# Patient Record
Sex: Female | Born: 1935 | Race: White | Hispanic: No | Smoking: Former smoker
Health system: Southern US, Community
[De-identification: ages and names within clinical notes are randomized; demographics above are authoritative.]

## PROBLEM LIST (undated history)

## (undated) DIAGNOSIS — K5790 Diverticulosis of intestine, part unspecified, without perforation or abscess without bleeding: Secondary | ICD-10-CM

## (undated) DIAGNOSIS — I10 Essential (primary) hypertension: Secondary | ICD-10-CM

## (undated) DIAGNOSIS — E785 Hyperlipidemia, unspecified: Secondary | ICD-10-CM

## (undated) DIAGNOSIS — J449 Chronic obstructive pulmonary disease, unspecified: Secondary | ICD-10-CM

## (undated) DIAGNOSIS — K219 Gastro-esophageal reflux disease without esophagitis: Secondary | ICD-10-CM

## (undated) DIAGNOSIS — T7840XA Allergy, unspecified, initial encounter: Secondary | ICD-10-CM

## (undated) DIAGNOSIS — E079 Disorder of thyroid, unspecified: Secondary | ICD-10-CM

## (undated) DIAGNOSIS — M1990 Unspecified osteoarthritis, unspecified site: Secondary | ICD-10-CM

## (undated) HISTORY — DX: Diverticulosis of intestine, part unspecified, without perforation or abscess without bleeding: K57.90

## (undated) HISTORY — DX: Allergy, unspecified, initial encounter: T78.40XA

## (undated) HISTORY — DX: Hyperlipidemia, unspecified: E78.5

## (undated) HISTORY — DX: Disorder of thyroid, unspecified: E07.9

## (undated) HISTORY — DX: Chronic obstructive pulmonary disease, unspecified: J44.9

## (undated) HISTORY — DX: Essential (primary) hypertension: I10

## (undated) HISTORY — DX: Gastro-esophageal reflux disease without esophagitis: K21.9

## (undated) HISTORY — DX: Unspecified osteoarthritis, unspecified site: M19.90

---

## 1998-07-25 ENCOUNTER — Ambulatory Visit (HOSPITAL_COMMUNITY): Admission: RE | Admit: 1998-07-25 | Discharge: 1998-07-25 | Payer: Self-pay | Admitting: Obstetrics & Gynecology

## 1998-07-25 ENCOUNTER — Other Ambulatory Visit: Admission: RE | Admit: 1998-07-25 | Discharge: 1998-07-25 | Payer: Self-pay | Admitting: Obstetrics & Gynecology

## 1998-08-29 ENCOUNTER — Ambulatory Visit (HOSPITAL_COMMUNITY): Admission: RE | Admit: 1998-08-29 | Discharge: 1998-08-29 | Payer: Self-pay | Admitting: Family Medicine

## 1999-07-24 ENCOUNTER — Ambulatory Visit (HOSPITAL_COMMUNITY): Admission: RE | Admit: 1999-07-24 | Discharge: 1999-07-24 | Payer: Self-pay | Admitting: Obstetrics & Gynecology

## 2000-08-05 ENCOUNTER — Ambulatory Visit (HOSPITAL_COMMUNITY): Admission: RE | Admit: 2000-08-05 | Discharge: 2000-08-05 | Payer: Self-pay | Admitting: *Deleted

## 2000-09-05 ENCOUNTER — Ambulatory Visit (HOSPITAL_COMMUNITY): Admission: RE | Admit: 2000-09-05 | Discharge: 2000-09-05 | Payer: Self-pay

## 2001-07-28 ENCOUNTER — Ambulatory Visit (HOSPITAL_COMMUNITY): Admission: RE | Admit: 2001-07-28 | Discharge: 2001-07-28 | Payer: Self-pay | Admitting: *Deleted

## 2001-09-06 ENCOUNTER — Ambulatory Visit (HOSPITAL_COMMUNITY): Admission: RE | Admit: 2001-09-06 | Discharge: 2001-09-06 | Payer: Self-pay | Admitting: *Deleted

## 2002-08-03 ENCOUNTER — Ambulatory Visit (HOSPITAL_COMMUNITY): Admission: RE | Admit: 2002-08-03 | Discharge: 2002-08-03 | Payer: Self-pay | Admitting: *Deleted

## 2003-07-31 ENCOUNTER — Ambulatory Visit (HOSPITAL_COMMUNITY): Admission: RE | Admit: 2003-07-31 | Discharge: 2003-07-31 | Payer: Self-pay | Admitting: *Deleted

## 2005-10-29 ENCOUNTER — Ambulatory Visit: Payer: Self-pay | Admitting: Gastroenterology

## 2005-12-08 ENCOUNTER — Ambulatory Visit: Payer: Self-pay | Admitting: Gastroenterology

## 2005-12-16 ENCOUNTER — Encounter (INDEPENDENT_AMBULATORY_CARE_PROVIDER_SITE_OTHER): Payer: Self-pay | Admitting: Specialist

## 2005-12-16 ENCOUNTER — Ambulatory Visit: Payer: Self-pay | Admitting: Gastroenterology

## 2005-12-16 LAB — HM COLONOSCOPY

## 2006-12-27 ENCOUNTER — Ambulatory Visit (HOSPITAL_COMMUNITY): Admission: RE | Admit: 2006-12-27 | Discharge: 2006-12-27 | Payer: Self-pay | Admitting: Ophthalmology

## 2010-11-23 ENCOUNTER — Encounter: Payer: Self-pay | Admitting: Nurse Practitioner

## 2010-11-23 DIAGNOSIS — Z9989 Dependence on other enabling machines and devices: Secondary | ICD-10-CM

## 2010-11-23 DIAGNOSIS — E039 Hypothyroidism, unspecified: Secondary | ICD-10-CM | POA: Insufficient documentation

## 2010-11-23 DIAGNOSIS — I872 Venous insufficiency (chronic) (peripheral): Secondary | ICD-10-CM | POA: Insufficient documentation

## 2010-11-23 DIAGNOSIS — K5790 Diverticulosis of intestine, part unspecified, without perforation or abscess without bleeding: Secondary | ICD-10-CM

## 2010-11-23 DIAGNOSIS — I10 Essential (primary) hypertension: Secondary | ICD-10-CM | POA: Insufficient documentation

## 2010-11-23 DIAGNOSIS — K219 Gastro-esophageal reflux disease without esophagitis: Secondary | ICD-10-CM | POA: Insufficient documentation

## 2010-11-23 DIAGNOSIS — E785 Hyperlipidemia, unspecified: Secondary | ICD-10-CM | POA: Insufficient documentation

## 2010-11-23 DIAGNOSIS — J449 Chronic obstructive pulmonary disease, unspecified: Secondary | ICD-10-CM

## 2010-12-04 NOTE — Op Note (Signed)
NAME:  Nancy Holt, Nancy Holt              ACCOUNT NO.:  [id number]   MEDICAL RECORD NO.:  [id number]          PATIENT TYPE:  AMB   LOCATION:  SDS                          FACILITY:  [facility]   PHYSICIAN:  Robert L. Dione Booze, M.D.  DATE OF BIRTH:  July 28, 1922   DATE OF PROCEDURE:  DATE OF DISCHARGE:                               OPERATIVE REPORT   INDICATIONS AND JUSTIFICATION FOR THE PROCEDURE:  This lady has been  followed in my office for several visits and has noticed for the last  several years redundant skin of each upper eyelid blocking her upper  field of vision.  She came on Nov 29, 2006 to discuss this problem and  was found to have basically severe dermatochalasis.  She reports that  she feels fatigue with her upper eyelids and the lids feel heavy, but  there is no particular irritation.  She does feel this blocks her upper  field of vision.  The surgery has been discussed.  Otherwise, she has a  pressure of 18 in each eye.  The pupil's motility, conjunctivae, cornea,  anterior chamber, lens and dilated fundus examination shows some sign of  cataract and she does have optic nerve cupping suspicious for glaucoma.  She also has significant dermatochalasis with the skin of the eyelids  covering her eyelashes.  Photographs were taken to document this.  Visual field testing with the skin taped compared to when it was not  taped shows a significant reduction in the upper field of vision.  Basically it shows a reduction of about 15 to 20 degrees in each eye.  These issues were discussed and the patient decided to have upper eyelid  blepharoplasties because of her symptoms.  Medically, she should be  stable for this.   JUSTIFICATION FOR PERFORMING THE PROCEDURE IN AN OUTPATIENT SETTING:  Routine.   JUSTIFICATION FOR OVERNIGHT STAY:  None.   PREOPERATIVE DIAGNOSIS:  Severe dermatochalasis with visual impairment.   POSTOPERATIVE DIAGNOSIS:  Severe dermatochalasis with visual impairment.   OPERATION PERFORMED:  Upper eyelid blepharoplasties.   SURGEON:  Robert L. Dione Booze, M.D.   ANESTHESIA:  Xylocaine 1% with epinephrine.   PROCEDURE:  The patient arrived in the minor surgery room and was  prepped and draped in the routine fashion.  The skin to be excised was  carefully demarcated and then excised along with some underlying fatty  tissue.  Bleeding was controlled with pressure and cautery.  Each wound  was closed with running 6-0 nylon suture and cold compresses were  applied along with Polysporin ointment.  The patient left the minor room  having done nicely.   FOLLOWUP CARE:  The patient is to be seen in my office in about 6 days  to have her sutures removed.  She is to use cold compresses for the rest  of the day and is to start warm compresses the next day.  She is to keep  her head elevated for the first day.           ______________________________  Doris Cheadle. Dione Booze, M.D.     RLG/MEDQ  D:  12/28/2006  T:  12/28/2006  Job:  [id number]

## 2012-10-02 ENCOUNTER — Other Ambulatory Visit: Payer: Self-pay | Admitting: *Deleted

## 2012-10-02 DIAGNOSIS — E039 Hypothyroidism, unspecified: Secondary | ICD-10-CM

## 2012-11-14 ENCOUNTER — Other Ambulatory Visit: Payer: Self-pay | Admitting: Nurse Practitioner

## 2012-11-18 ENCOUNTER — Other Ambulatory Visit: Payer: Self-pay | Admitting: Family Medicine

## 2012-11-22 ENCOUNTER — Other Ambulatory Visit: Payer: Self-pay

## 2012-11-22 ENCOUNTER — Ambulatory Visit: Payer: Self-pay

## 2013-01-14 ENCOUNTER — Other Ambulatory Visit: Payer: Self-pay | Admitting: Nurse Practitioner

## 2013-01-30 ENCOUNTER — Other Ambulatory Visit: Payer: Self-pay | Admitting: Nurse Practitioner

## 2013-02-02 ENCOUNTER — Encounter: Payer: Self-pay | Admitting: Nurse Practitioner

## 2013-02-02 ENCOUNTER — Ambulatory Visit (INDEPENDENT_AMBULATORY_CARE_PROVIDER_SITE_OTHER): Payer: Medicare Other | Admitting: Nurse Practitioner

## 2013-02-02 VITALS — BP 119/53 | HR 71 | Temp 97.0°F | Ht 65.0 in | Wt 176.0 lb

## 2013-02-02 DIAGNOSIS — I10 Essential (primary) hypertension: Secondary | ICD-10-CM

## 2013-02-02 DIAGNOSIS — E785 Hyperlipidemia, unspecified: Secondary | ICD-10-CM

## 2013-02-02 DIAGNOSIS — K219 Gastro-esophageal reflux disease without esophagitis: Secondary | ICD-10-CM

## 2013-02-02 DIAGNOSIS — E039 Hypothyroidism, unspecified: Secondary | ICD-10-CM

## 2013-02-02 MED ORDER — LOSARTAN POTASSIUM-HCTZ 100-25 MG PO TABS: 1.0000 | ORAL_TABLET | Freq: Every day | ORAL | Status: DC

## 2013-02-02 MED ORDER — LEVOTHYROXINE SODIUM 25 MCG PO TABS: 25.0000 ug | ORAL_TABLET | Freq: Every day | ORAL | Status: DC

## 2013-02-02 MED ORDER — DILTIAZEM HCL ER COATED BEADS 240 MG PO CP24: 240.0000 mg | ORAL_CAPSULE | Freq: Every day | ORAL | Status: DC

## 2013-02-02 MED ORDER — OMEPRAZOLE 40 MG PO CPDR: 40.0000 mg | DELAYED_RELEASE_CAPSULE | Freq: Every day | ORAL | Status: DC

## 2013-02-02 NOTE — Patient Instructions (Signed)

## 2013-02-02 NOTE — Progress Notes (Signed)
Subjective:    Patient ID: Nancy Holt, female    DOB: Jan 31, 1923, 79 y.o.   MRN: [id number]  Hypertension This is a chronic problem. The current episode started more than 1 year ago. The problem is unchanged. The problem is controlled. Pertinent negatives include no blurred vision, chest pain, headaches, neck pain, orthopnea, palpitations, peripheral edema, shortness of breath or sweats. There are no associated agents to hypertension. Risk factors for coronary artery disease include dyslipidemia. Past treatments include angiotensin blockers and diuretics. The current treatment provides moderate improvement. Compliance problems include diet and exercise.  Hypertensive end-organ damage includes a thyroid problem.  Hyperlipidemia This is a chronic problem. The current episode started more than 1 year ago. The problem is controlled. Recent lipid tests were reviewed and are normal. Exacerbating diseases include hypothyroidism. She has no history of diabetes or obesity. Factors aggravating her hyperlipidemia include thiazides. Pertinent negatives include no chest pain or shortness of breath. Current antihyperlipidemic treatment includes diet change and herbal therapy. The current treatment provides moderate improvement of lipids. Compliance problems include adherence to diet and adherence to exercise.  Risk factors for coronary artery disease include hypertension.  Thyroid Problem Presents for follow-up (hypothyroidism) visit. Patient reports no anxiety, depressed mood, diaphoresis, diarrhea, fatigue, hair loss, heat intolerance, hoarse voice, palpitations, visual change, weight gain or weight loss. The symptoms have been stable. Her past medical history is significant for hyperlipidemia. There is no history of diabetes.  Gerd Omeprazole OTC working well to keep symptoms under control    Review of Systems  Constitutional: Negative for weight loss, weight gain, diaphoresis and fatigue.  HENT:  Negative for hoarse voice and neck pain.   Eyes: Negative for blurred vision.  Respiratory: Negative for shortness of breath.   Cardiovascular: Negative for chest pain, palpitations and orthopnea.  Gastrointestinal: Negative for diarrhea.  Endocrine: Negative for heat intolerance.  Neurological: Negative for headaches.  All other systems reviewed and are negative.       Objective:   Physical Exam  Constitutional: She is oriented to person, place, and time. She appears well-developed and well-nourished.  HENT:  Nose: Nose normal.  Mouth/Throat: Oropharynx is clear and moist.  Eyes: EOM are normal.  Neck: Trachea normal, normal range of motion and full passive range of motion without pain. Neck supple. No JVD present. Carotid bruit is not present. No thyromegaly present.  Cardiovascular: Normal rate, regular rhythm, normal heart sounds and intact distal pulses.  Exam reveals no gallop and no friction rub.   No murmur heard. Pulmonary/Chest: Effort normal and breath sounds normal.  Abdominal: Soft. Bowel sounds are normal. She exhibits no distension and no mass. There is no tenderness.  Musculoskeletal: Normal range of motion.  Lymphadenopathy:    She has no cervical adenopathy.  Neurological: She is alert and oriented to person, place, and time. She has normal reflexes.  Skin: Skin is warm and dry.  Psychiatric: She has a normal mood and affect. Her behavior is normal. Judgment and thought content normal.   BP 119/53  Pulse 71  Temp(Src) 97 F (36.1 C) (Oral)  Ht 5\' 5"  (1.651 m)  Wt 176 lb (79.833 kg)  BMI 29.29 kg/m2        Assessment & Plan:  1. Hypertension Low NA+ diet - losartan-hydrochlorothiazide (HYZAAR) 100-25 MG per tablet; Take 1 tablet by mouth daily.  Dispense: 30 tablet; Refill: 5 - diltiazem (CARDIZEM CD) 240 MG 24 hr capsule; Take 1 capsule (240 mg total) by mouth daily.  Dispense: 30 capsule; Refill: 5 - COMPLETE METABOLIC PANEL WITH GFR  2.  Hyperlipidemia Low fat diet an dexercise - NMR Lipoprofile with Lipids  3. Hypothyroidism - levothyroxine (SYNTHROID, LEVOTHROID) 25 MCG tablet; Take 1 tablet (25 mcg total) by mouth daily.  Dispense: 30 tablet; Refill: 5 - Thyroid Panel With TSH  4. GERD (gastroesophageal reflux disease) Avoid spicy foods Do not eat 2 hours rpior to bedtime - omeprazole (PRILOSEC) 40 MG capsule; Take 1 capsule (40 mg total) by mouth daily.  Dispense: 30 capsule; Refill: 5  Mary-Margaret Daphine Deutscher, FNP

## 2013-02-03 LAB — COMPLETE METABOLIC PANEL WITH GFR
ALT: 10 U/L (ref 0–35)
AST: 13 U/L (ref 0–37)
Albumin: 4.2 g/dL (ref 3.5–5.2)
Alkaline Phosphatase: 20 U/L — ABNORMAL LOW (ref 39–117)
BUN: 22 mg/dL (ref 6–23)
CO2: 31 mEq/L (ref 19–32)
Calcium: 9.6 mg/dL (ref 8.4–10.5)
Chloride: 102 mEq/L (ref 96–112)
Creat: 1.25 mg/dL — ABNORMAL HIGH (ref 0.50–1.10)
GFR, Est African American: 44 mL/min — ABNORMAL LOW
GFR, Est Non African American: 38 mL/min — ABNORMAL LOW
Glucose, Bld: 99 mg/dL (ref 70–99)
Potassium: 4.5 mEq/L (ref 3.5–5.3)
Sodium: 138 mEq/L (ref 135–145)
Total Bilirubin: 0.3 mg/dL (ref 0.3–1.2)
Total Protein: 7.2 g/dL (ref 6.0–8.3)

## 2013-02-03 LAB — THYROID PANEL WITH TSH
Free Thyroxine Index: 2.7 (ref 1.0–3.9)
T3 Uptake: 36 % (ref 22.5–37.0)
T4, Total: 7.6 ug/dL (ref 5.0–12.5)
TSH: 3.111 u[IU]/mL (ref 0.350–4.500)

## 2013-02-06 LAB — NMR LIPOPROFILE WITH LIPIDS
Cholesterol, Total: 185 mg/dL (ref <200)
HDL Particle Number: 31 umol/L (ref >=30.5)
HDL Size: 9.1 nm — ABNORMAL LOW (ref >=9.2)
HDL-C: 53 mg/dL (ref >=40)
LDL (calc): 110 mg/dL — ABNORMAL HIGH (ref <100)
LDL Particle Number: 1339 nmol/L — ABNORMAL HIGH (ref <1000)
LDL Size: 21.4 nm (ref >20.5)
LP-IR Score: <25 (ref <=45)
Large HDL-P: 7.1 umol/L (ref >=4.8)
Large VLDL-P: 1 nmol/L (ref <=2.7)
Small LDL Particle Number: 410 nmol/L (ref <=527)
Triglycerides: 108 mg/dL (ref <150)
VLDL Size: 40.9 nm (ref <=46.6)

## 2013-02-13 ENCOUNTER — Other Ambulatory Visit: Payer: Self-pay | Admitting: Nurse Practitioner

## 2013-03-07 ENCOUNTER — Other Ambulatory Visit: Payer: Self-pay | Admitting: *Deleted

## 2013-03-07 MED ORDER — FUROSEMIDE 40 MG PO TABS: 40.0000 mg | ORAL_TABLET | Freq: Two times a day (BID) | ORAL | Status: DC

## 2013-03-07 NOTE — Telephone Encounter (Signed)
Pharmacy sent refill request for furosemide but was not listed in epic. Wasn't sure if we should refill or not. Please advise. Original rx was from last year

## 2013-05-23 ENCOUNTER — Ambulatory Visit (INDEPENDENT_AMBULATORY_CARE_PROVIDER_SITE_OTHER): Payer: Medicare Other | Admitting: Nurse Practitioner

## 2013-05-23 VITALS — BP 122/56 | HR 73 | Temp 97.3°F | Ht 65.0 in | Wt 178.0 lb

## 2013-05-23 DIAGNOSIS — K219 Gastro-esophageal reflux disease without esophagitis: Secondary | ICD-10-CM

## 2013-05-23 DIAGNOSIS — I10 Essential (primary) hypertension: Secondary | ICD-10-CM

## 2013-05-23 DIAGNOSIS — E039 Hypothyroidism, unspecified: Secondary | ICD-10-CM

## 2013-05-23 DIAGNOSIS — E785 Hyperlipidemia, unspecified: Secondary | ICD-10-CM

## 2013-05-23 DIAGNOSIS — E559 Vitamin D deficiency, unspecified: Secondary | ICD-10-CM

## 2013-05-23 NOTE — Progress Notes (Signed)
Subjective:    Patient ID: Nancy Holt, female    DOB: 29-Jan-1923, 79 y.o.   MRN: [id number]  Hypertension This is a chronic problem. The current episode started more than 1 year ago. The problem is unchanged. The problem is controlled. Pertinent negatives include no blurred vision, chest pain, headaches, neck pain, orthopnea, palpitations, peripheral edema, shortness of breath or sweats. There are no associated agents to hypertension. Risk factors for coronary artery disease include dyslipidemia and post-menopausal state. Past treatments include angiotensin blockers and diuretics. The current treatment provides moderate improvement. Compliance problems include diet and exercise.  Hypertensive end-organ damage includes a thyroid problem.  Hyperlipidemia This is a chronic problem. The current episode started more than 1 year ago. The problem is controlled. Recent lipid tests were reviewed and are normal. Exacerbating diseases include hypothyroidism. She has no history of diabetes or obesity. Factors aggravating her hyperlipidemia include thiazides. Pertinent negatives include no chest pain or shortness of breath. Current antihyperlipidemic treatment includes diet change and herbal therapy. The current treatment provides moderate improvement of lipids. Compliance problems include adherence to diet and adherence to exercise.  Risk factors for coronary artery disease include hypertension.  Thyroid Problem Presents for follow-up (hypothyroidism) visit. Patient reports no anxiety, depressed mood, diaphoresis, diarrhea, fatigue, hair loss, heat intolerance, hoarse voice, palpitations, visual change, weight gain or weight loss. The symptoms have been stable. Her past medical history is significant for hyperlipidemia. There is no history of diabetes.  Gastrophageal Reflux She reports no chest pain, no heartburn or no hoarse voice. This is a chronic problem. The current episode started more than 1 year ago.  The problem occurs rarely. The problem has been resolved. The symptoms are aggravated by certain foods. Pertinent negatives include no fatigue or weight loss. She has tried a PPI for the symptoms. The treatment provided significant relief.      Review of Systems  Constitutional: Negative for weight loss, weight gain, diaphoresis and fatigue.  HENT: Negative for hoarse voice.   Eyes: Negative for blurred vision.  Respiratory: Negative for shortness of breath.   Cardiovascular: Negative for chest pain, palpitations and orthopnea.  Gastrointestinal: Negative for heartburn and diarrhea.  Endocrine: Negative for heat intolerance.  Musculoskeletal: Negative for neck pain.  Neurological: Negative for headaches.  All other systems reviewed and are negative.       Objective:   Physical Exam  Constitutional: She is oriented to person, place, and time. She appears well-developed and well-nourished.  HENT:  Nose: Nose normal.  Mouth/Throat: Oropharynx is clear and moist.  Eyes: EOM are normal.  Neck: Trachea normal, normal range of motion and full passive range of motion without pain. Neck supple. No JVD present. Carotid bruit is not present. No thyromegaly present.  Cardiovascular: Normal rate, regular rhythm, normal heart sounds and intact distal pulses.  Exam reveals no gallop and no friction rub.   No murmur heard. Pulmonary/Chest: Effort normal and breath sounds normal.  Abdominal: Soft. Bowel sounds are normal. She exhibits no distension and no mass. There is no tenderness.  Musculoskeletal: Normal range of motion. She exhibits no edema.  Lymphadenopathy:    She has no cervical adenopathy.  Neurological: She is alert and oriented to person, place, and time. She has normal reflexes.  Skin: Skin is warm and dry.  Psychiatric: She has a normal mood and affect. Her behavior is normal. Judgment and thought content normal.   BP 122/56  Pulse 73  Temp(Src) 97.3 F (36.3 C) (Oral)  Ht 5'  5" (1.651 m)  Wt 178 lb (80.74 kg)  BMI 29.62 kg/m2        Assessment & Plan:   1. HTN (hypertension)   2. GERD (gastroesophageal reflux disease)   3. Hypothyroidism   4. Hyperlipemia   5. Unspecified vitamin D deficiency    Orders Placed This Encounter  Procedures  . CMP14+EGFR  . NMR, lipoprofile  . Vit D  25 hydroxy (rtn osteoporosis monitoring)     Continue all meds Labs pending Diet and exercise encouraged Health maintenance reviewed Follow up in 3 months  Mary-Margaret Daphine Deutscher, FNP

## 2013-05-23 NOTE — Patient Instructions (Signed)

## 2013-05-24 ENCOUNTER — Encounter: Payer: Self-pay | Admitting: Nurse Practitioner

## 2013-05-25 LAB — CMP14+EGFR
ALT: 12 IU/L (ref 0–32)
AST: 14 IU/L (ref 0–40)
Albumin/Globulin Ratio: 1.6 (ref 1.1–2.5)
Albumin: 4.2 g/dL (ref 3.5–4.7)
Alkaline Phosphatase: 21 IU/L — ABNORMAL LOW (ref 39–117)
BUN/Creatinine Ratio: 17 (ref 11–26)
BUN: 20 mg/dL (ref 8–27)
CO2: 25 mmol/L (ref 18–29)
Calcium: 9.7 mg/dL (ref 8.6–10.2)
Chloride: 100 mmol/L (ref 97–108)
Creatinine, Ser: 1.18 mg/dL — ABNORMAL HIGH (ref 0.57–1.00)
GFR calc Af Amer: 48 mL/min/{1.73_m2} — ABNORMAL LOW (ref >59)
GFR calc non Af Amer: 41 mL/min/{1.73_m2} — ABNORMAL LOW (ref >59)
Globulin, Total: 2.6 g/dL (ref 1.5–4.5)
Glucose: 94 mg/dL (ref 65–99)
Potassium: 4.6 mmol/L (ref 3.5–5.2)
Sodium: 140 mmol/L (ref 134–144)
Total Bilirubin: 0.3 mg/dL (ref 0.0–1.2)
Total Protein: 6.8 g/dL (ref 6.0–8.5)

## 2013-05-25 LAB — NMR, LIPOPROFILE
Cholesterol: 194 mg/dL (ref <200)
HDL Cholesterol by NMR: 56 mg/dL (ref >=40)
HDL Particle Number: 34.1 umol/L (ref >=30.5)
LDL Particle Number: 1498 nmol/L — ABNORMAL HIGH (ref <1000)
LDL Size: 21.3 nm (ref >20.5)
LDLC SERPL CALC-MCNC: 115 mg/dL — ABNORMAL HIGH (ref <100)
LP-IR Score: <25 (ref <=45)
Small LDL Particle Number: 424 nmol/L (ref <=527)
Triglycerides by NMR: 113 mg/dL (ref <150)

## 2013-05-25 LAB — VITAMIN D 25 HYDROXY (VIT D DEFICIENCY, FRACTURES): Vit D, 25-Hydroxy: 55 ng/mL (ref 30.0–100.0)

## 2013-06-05 ENCOUNTER — Encounter: Payer: Self-pay | Admitting: *Deleted

## 2013-06-05 NOTE — Progress Notes (Signed)
Quick Note:  Copy of labs sent to patient ______ 

## 2013-06-07 LAB — HM DEXA SCAN: HM Dexa Scan: NORMAL

## 2013-07-11 ENCOUNTER — Other Ambulatory Visit: Payer: Self-pay | Admitting: Nurse Practitioner

## 2013-08-08 ENCOUNTER — Other Ambulatory Visit: Payer: Self-pay | Admitting: Nurse Practitioner

## 2013-10-10 ENCOUNTER — Other Ambulatory Visit: Payer: Self-pay | Admitting: Nurse Practitioner

## 2013-11-07 ENCOUNTER — Encounter: Payer: Self-pay | Admitting: Nurse Practitioner

## 2013-11-07 ENCOUNTER — Ambulatory Visit (INDEPENDENT_AMBULATORY_CARE_PROVIDER_SITE_OTHER): Payer: Commercial Managed Care - HMO | Admitting: Nurse Practitioner

## 2013-11-07 VITALS — BP 138/79 | HR 67 | Temp 98.0°F | Ht 65.0 in | Wt 173.4 lb

## 2013-11-07 DIAGNOSIS — E039 Hypothyroidism, unspecified: Secondary | ICD-10-CM

## 2013-11-07 DIAGNOSIS — J449 Chronic obstructive pulmonary disease, unspecified: Secondary | ICD-10-CM

## 2013-11-07 DIAGNOSIS — Z713 Dietary counseling and surveillance: Secondary | ICD-10-CM

## 2013-11-07 DIAGNOSIS — Z6828 Body mass index (BMI) 28.0-28.9, adult: Secondary | ICD-10-CM

## 2013-11-07 DIAGNOSIS — E785 Hyperlipidemia, unspecified: Secondary | ICD-10-CM

## 2013-11-07 DIAGNOSIS — K219 Gastro-esophageal reflux disease without esophagitis: Secondary | ICD-10-CM

## 2013-11-07 DIAGNOSIS — I2510 Atherosclerotic heart disease of native coronary artery without angina pectoris: Secondary | ICD-10-CM

## 2013-11-07 DIAGNOSIS — I10 Essential (primary) hypertension: Secondary | ICD-10-CM

## 2013-11-07 MED ORDER — OMEPRAZOLE 40 MG PO CPDR: 40.0000 mg | DELAYED_RELEASE_CAPSULE | Freq: Every day | ORAL | Status: DC

## 2013-11-07 MED ORDER — DILTIAZEM HCL ER COATED BEADS 240 MG PO CP24: ORAL_CAPSULE | ORAL | Status: DC

## 2013-11-07 MED ORDER — LOSARTAN POTASSIUM-HCTZ 100-25 MG PO TABS: ORAL_TABLET | ORAL | Status: DC

## 2013-11-07 MED ORDER — LEVOTHYROXINE SODIUM 25 MCG PO TABS: ORAL_TABLET | ORAL | Status: DC

## 2013-11-07 NOTE — Patient Instructions (Signed)

## 2013-11-07 NOTE — Progress Notes (Signed)
Subjective:    Patient ID: Nancy Holt, female    DOB: September 19, 1922, 80 y.o.   MRN: [id number]  Patient here today for follow up of chronic medical problems. No complaints today- Just recently had a life line screening and all test were normal.   Hypertension This is a chronic problem. The current episode started more than 1 year ago. The problem is unchanged. The problem is controlled. Pertinent negatives include no blurred vision, chest pain, headaches, neck pain, orthopnea, palpitations, peripheral edema, shortness of breath or sweats. There are no associated agents to hypertension. Risk factors for coronary artery disease include dyslipidemia and post-menopausal state. Past treatments include angiotensin blockers and diuretics. The current treatment provides moderate improvement. Compliance problems include diet and exercise.  Hypertensive end-organ damage includes a thyroid problem.  Hyperlipidemia This is a chronic problem. The current episode started more than 1 year ago. The problem is controlled. Recent lipid tests were reviewed and are normal. Exacerbating diseases include hypothyroidism. She has no history of diabetes or obesity. Factors aggravating her hyperlipidemia include thiazides. Pertinent negatives include no chest pain or shortness of breath. Current antihyperlipidemic treatment includes diet change and herbal therapy. The current treatment provides moderate improvement of lipids. Compliance problems include adherence to diet and adherence to exercise.  Risk factors for coronary artery disease include hypertension.  Thyroid Problem Presents for follow-up (hypothyroidism) visit. Patient reports no anxiety, depressed mood, diaphoresis, diarrhea, fatigue, hair loss, heat intolerance, hoarse voice, palpitations, visual change, weight gain or weight loss. The symptoms have been stable. Her past medical history is significant for hyperlipidemia. There is no history of diabetes.   Gastrophageal Reflux She reports no chest pain, no heartburn or no hoarse voice. This is a chronic problem. The current episode started more than 1 year ago. The problem occurs rarely. The problem has been resolved. The symptoms are aggravated by certain foods. Pertinent negatives include no fatigue or weight loss. She has tried a PPI for the symptoms. The treatment provided significant relief.      Review of Systems  Constitutional: Negative for weight loss, weight gain, diaphoresis and fatigue.  HENT: Negative for hoarse voice.   Eyes: Negative for blurred vision.  Respiratory: Negative for shortness of breath.   Cardiovascular: Negative for chest pain, palpitations and orthopnea.  Gastrointestinal: Negative for heartburn and diarrhea.  Endocrine: Negative for heat intolerance.  Musculoskeletal: Negative for neck pain.  Neurological: Negative for headaches.  All other systems reviewed and are negative.      Objective:   Physical Exam  Constitutional: She is oriented to person, place, and time. She appears well-developed and well-nourished.  HENT:  Nose: Nose normal.  Mouth/Throat: Oropharynx is clear and moist.  Eyes: EOM are normal.  Neck: Trachea normal, normal range of motion and full passive range of motion without pain. Neck supple. No JVD present. Carotid bruit is not present. No thyromegaly present.  Cardiovascular: Normal rate, regular rhythm, normal heart sounds and intact distal pulses.  Exam reveals no gallop and no friction rub.   No murmur heard. Pulmonary/Chest: Effort normal and breath sounds normal.  Abdominal: Soft. Bowel sounds are normal. She exhibits no distension and no mass. There is no tenderness.  Musculoskeletal: Normal range of motion. She exhibits no edema.  Lymphadenopathy:    She has no cervical adenopathy.  Neurological: She is alert and oriented to person, place, and time. She has normal reflexes.  Skin: Skin is warm and dry.  Psychiatric: She  has a normal  mood and affect. Her behavior is normal. Judgment and thought content normal.   BP 138/79  Pulse 67  Temp(Src) 98 F (36.7 C) (Oral)  Ht $R'5\' 5"'SU$  (1.651 m)  Wt 173 lb 6.4 oz (78.654 kg)  BMI 28.86 kg/m2        Assessment & Plan:   1. Hypothyroidism   2. Hyperlipemia   3. HTN (hypertension)   4. GERD (gastroesophageal reflux disease)   5. COPD (chronic obstructive pulmonary disease)   6. BMI 28.0-28.9,adult   7. Weight loss counseling, encounter for   8. CAD (coronary artery disease)    Orders Placed This Encounter  Procedures  . HM DEXA SCAN    This external order was created through the Results Console.  . CMP14+EGFR  . NMR, lipoprofile  . Thyroid Panel With TSH   Meds ordered this encounter  Medications  . omeprazole (PRILOSEC) 40 MG capsule    Sig: Take 1 capsule (40 mg total) by mouth daily.    Dispense:  30 capsule    Refill:  5    Order Specific Question:  Supervising Provider    Answer:  Chipper Herb [[id number]]  . diltiazem (CARDIZEM CD) 240 MG 24 hr capsule    Sig: TAKE 1 CAPSULE (240 MG TOTAL) BY MOUTH DAILY.    Dispense:  30 capsule    Refill:  5    Order Specific Question:  Supervising Provider    Answer:  Chipper Herb [[id number]]  . levothyroxine (SYNTHROID, LEVOTHROID) 25 MCG tablet    Sig: TAKE ONE TABLET BY MOUTH ONE TIME DAILY    Dispense:  30 tablet    Refill:  5    Order Specific Question:  Supervising Provider    Answer:  Chipper Herb [[id number]]  . losartan-hydrochlorothiazide (HYZAAR) 100-25 MG per tablet    Sig: TAKE 1 TABLET BY MOUTH DAILY.    Dispense:  30 tablet    Refill:  5    Order Specific Question:  Supervising Provider    Answer:  Chipper Herb [[id number]]   Refuses colonoscopy and mammo Labs pending Health maintenance reviewed Diet and exercise encouraged Continue all meds Follow up  In 6 months   [place], FNP

## 2013-11-08 ENCOUNTER — Other Ambulatory Visit: Payer: Self-pay | Admitting: Nurse Practitioner

## 2013-11-08 DIAGNOSIS — E785 Hyperlipidemia, unspecified: Secondary | ICD-10-CM

## 2013-11-08 LAB — CMP14+EGFR
ALT: 11 IU/L (ref 0–32)
AST: 17 IU/L (ref 0–40)
Albumin/Globulin Ratio: 1.8 (ref 1.1–2.5)
Albumin: 4.4 g/dL (ref 3.5–4.7)
Alkaline Phosphatase: 23 IU/L — ABNORMAL LOW (ref 39–117)
BUN/Creatinine Ratio: 24 (ref 11–26)
BUN: 26 mg/dL (ref 8–27)
CO2: 25 mmol/L (ref 18–29)
Calcium: 10 mg/dL (ref 8.7–10.3)
Chloride: 99 mmol/L (ref 97–108)
Creatinine, Ser: 1.07 mg/dL — ABNORMAL HIGH (ref 0.57–1.00)
GFR calc Af Amer: 53 mL/min/{1.73_m2} — ABNORMAL LOW (ref >59)
GFR calc non Af Amer: 46 mL/min/{1.73_m2} — ABNORMAL LOW (ref >59)
Globulin, Total: 2.5 g/dL (ref 1.5–4.5)
Glucose: 90 mg/dL (ref 65–99)
Potassium: 5.1 mmol/L (ref 3.5–5.2)
Sodium: 142 mmol/L (ref 134–144)
Total Bilirubin: 0.3 mg/dL (ref 0.0–1.2)
Total Protein: 6.9 g/dL (ref 6.0–8.5)

## 2013-11-08 LAB — NMR, LIPOPROFILE
Cholesterol: 208 mg/dL — ABNORMAL HIGH (ref <200)
HDL Cholesterol by NMR: 55 mg/dL (ref >=40)
HDL Particle Number: 32.9 umol/L (ref >=30.5)
LDL Particle Number: 1699 nmol/L — ABNORMAL HIGH (ref <1000)
LDL Size: 21.1 nm (ref >20.5)
LDLC SERPL CALC-MCNC: 133 mg/dL — ABNORMAL HIGH (ref <100)
LP-IR Score: 40 (ref <=45)
Small LDL Particle Number: 761 nmol/L — ABNORMAL HIGH (ref <=527)
Triglycerides by NMR: 102 mg/dL (ref <150)

## 2013-11-08 LAB — THYROID PANEL WITH TSH
Free Thyroxine Index: 1.9 (ref 1.2–4.9)
T3 Uptake Ratio: 29 % (ref 24–39)
T4, Total: 6.4 ug/dL (ref 4.5–12.0)
TSH: 2.81 u[IU]/mL (ref 0.450–4.500)

## 2013-11-08 MED ORDER — ATORVASTATIN CALCIUM 40 MG PO TABS: 40.0000 mg | ORAL_TABLET | Freq: Every day | ORAL | Status: DC

## 2013-11-12 ENCOUNTER — Other Ambulatory Visit: Payer: Self-pay | Admitting: Nurse Practitioner

## 2013-11-12 MED ORDER — SIMVASTATIN 40 MG PO TABS: 40.0000 mg | ORAL_TABLET | Freq: Every day | ORAL | Status: DC

## 2014-01-15 ENCOUNTER — Other Ambulatory Visit: Payer: Self-pay | Admitting: Nurse Practitioner

## 2014-04-25 ENCOUNTER — Encounter: Payer: Self-pay | Admitting: Nurse Practitioner

## 2014-04-25 ENCOUNTER — Ambulatory Visit (INDEPENDENT_AMBULATORY_CARE_PROVIDER_SITE_OTHER): Payer: Commercial Managed Care - HMO | Admitting: Nurse Practitioner

## 2014-04-25 VITALS — BP 171/67 | HR 72 | Temp 98.9°F | Ht 64.0 in | Wt 172.2 lb

## 2014-04-25 DIAGNOSIS — K5730 Diverticulosis of large intestine without perforation or abscess without bleeding: Secondary | ICD-10-CM

## 2014-04-25 DIAGNOSIS — E785 Hyperlipidemia, unspecified: Secondary | ICD-10-CM

## 2014-04-25 DIAGNOSIS — I872 Venous insufficiency (chronic) (peripheral): Secondary | ICD-10-CM

## 2014-04-25 DIAGNOSIS — M25561 Pain in right knee: Secondary | ICD-10-CM

## 2014-04-25 DIAGNOSIS — E034 Atrophy of thyroid (acquired): Secondary | ICD-10-CM

## 2014-04-25 DIAGNOSIS — I10 Essential (primary) hypertension: Secondary | ICD-10-CM

## 2014-04-25 DIAGNOSIS — Z23 Encounter for immunization: Secondary | ICD-10-CM

## 2014-04-25 DIAGNOSIS — K219 Gastro-esophageal reflux disease without esophagitis: Secondary | ICD-10-CM

## 2014-04-25 DIAGNOSIS — E038 Other specified hypothyroidism: Secondary | ICD-10-CM

## 2014-04-25 DIAGNOSIS — L03032 Cellulitis of left toe: Secondary | ICD-10-CM

## 2014-04-25 DIAGNOSIS — I2510 Atherosclerotic heart disease of native coronary artery without angina pectoris: Secondary | ICD-10-CM

## 2014-04-25 DIAGNOSIS — J410 Simple chronic bronchitis: Secondary | ICD-10-CM

## 2014-04-25 DIAGNOSIS — M25562 Pain in left knee: Secondary | ICD-10-CM

## 2014-04-25 MED ORDER — DILTIAZEM HCL ER COATED BEADS 240 MG PO CP24: ORAL_CAPSULE | ORAL | Status: DC

## 2014-04-25 MED ORDER — LOSARTAN POTASSIUM-HCTZ 100-25 MG PO TABS: ORAL_TABLET | ORAL | Status: DC

## 2014-04-25 MED ORDER — LEVOTHYROXINE SODIUM 25 MCG PO TABS: ORAL_TABLET | ORAL | Status: DC

## 2014-04-25 MED ORDER — SIMVASTATIN 40 MG PO TABS: 40.0000 mg | ORAL_TABLET | Freq: Every day | ORAL | Status: DC

## 2014-04-25 MED ORDER — OMEPRAZOLE 40 MG PO CPDR: 40.0000 mg | DELAYED_RELEASE_CAPSULE | Freq: Every day | ORAL | Status: DC

## 2014-04-25 NOTE — Progress Notes (Signed)
Subjective:    Patient ID: Nancy Holt, female    DOB: 1923/02/24, 80 y.o.   MRN: [id number]  Patient here today for follow up of chronic medical problems. No complaints today-  Hypertension This is a chronic problem. The current episode started more than 1 year ago. The problem is unchanged. The problem is controlled. Pertinent negatives include no blurred vision, chest pain, headaches, neck pain, orthopnea, palpitations, peripheral edema, shortness of breath or sweats. There are no associated agents to hypertension. Risk factors for coronary artery disease include dyslipidemia and post-menopausal state. Past treatments include angiotensin blockers and diuretics. The current treatment provides moderate improvement. Compliance problems include diet and exercise.  Hypertensive end-organ damage includes a thyroid problem.  Hyperlipidemia This is a chronic problem. The current episode started more than 1 year ago. The problem is controlled. Recent lipid tests were reviewed and are normal. Exacerbating diseases include hypothyroidism. She has no history of diabetes or obesity. Factors aggravating her hyperlipidemia include thiazides. Pertinent negatives include no chest pain or shortness of breath. Current antihyperlipidemic treatment includes diet change and herbal therapy. The current treatment provides moderate improvement of lipids. Compliance problems include adherence to diet and adherence to exercise.  Risk factors for coronary artery disease include hypertension.  Thyroid Problem Presents for follow-up (hypothyroidism) visit. Patient reports no anxiety, depressed mood, diaphoresis, diarrhea, fatigue, hair loss, heat intolerance, hoarse voice, palpitations, visual change, weight gain or weight loss. The symptoms have been stable. Her past medical history is significant for hyperlipidemia. There is no history of diabetes.  Gastrophageal Reflux She reports no chest pain, no heartburn or no hoarse  voice. This is a chronic problem. The current episode started more than 1 year ago. The problem occurs rarely. The problem has been resolved. The symptoms are aggravated by certain foods. Pertinent negatives include no fatigue or weight loss. She has tried a PPI for the symptoms. The treatment provided significant relief.      Review of Systems  Constitutional: Negative for weight loss, weight gain, diaphoresis and fatigue.  HENT: Negative for hoarse voice.   Eyes: Negative for blurred vision.  Respiratory: Negative for shortness of breath.   Cardiovascular: Negative for chest pain, palpitations and orthopnea.  Gastrointestinal: Negative for heartburn and diarrhea.  Endocrine: Negative for heat intolerance.  Musculoskeletal: Negative for neck pain.  Neurological: Negative for headaches.  All other systems reviewed and are negative.      Objective:   Physical Exam  Constitutional: She is oriented to person, place, and time. She appears well-developed and well-nourished.  HENT:  Nose: Nose normal.  Mouth/Throat: Oropharynx is clear and moist.  Eyes: EOM are normal.  Neck: Trachea normal, normal range of motion and full passive range of motion without pain. Neck supple. No JVD present. Carotid bruit is not present. No thyromegaly present.  Cardiovascular: Normal rate, regular rhythm, normal heart sounds and intact distal pulses.  Exam reveals no gallop and no friction rub.   No murmur heard. Pulmonary/Chest: Effort normal and breath sounds normal.  Abdominal: Soft. Bowel sounds are normal. She exhibits no distension and no mass. There is no tenderness.  Musculoskeletal: Normal range of motion. She exhibits edema (1+ bil lower ext).  Bile knee edema with pain on flexion and extension  Lymphadenopathy:    She has no cervical adenopathy.  Neurological: She is alert and oriented to person, place, and time. She has normal reflexes.  Skin: Skin is warm and dry.  Psychiatric: She has a  normal  mood and affect. Her behavior is normal. Judgment and thought content normal.   BP 171/67  Pulse 72  Temp(Src) 98.9 F (37.2 C) (Oral)  Ht 5' 4" (1.626 m)  Wt 172 lb 3.2 oz (78.109 kg)  BMI 29.54 kg/m2        Assessment & Plan:   1. Venous (peripheral) insufficiency  2. Hypothyroidism due to acquired atrophy of thyroid - levothyroxine (SYNTHROID, LEVOTHROID) 25 MCG tablet; TAKE ONE TABLET BY MOUTH ONE TIME DAILY  Dispense: 30 tablet; Refill: 5 - Thyroid Panel With TSH  3. Hyperlipemia Low fat diet and exercise - simvastatin (ZOCOR) 40 MG tablet; Take 1 tablet (40 mg total) by mouth at bedtime.  Dispense: 30 tablet; Refill: 5 - NMR, lipoprofile  4. Essential hypertension Low Na+ diet - losartan-hydrochlorothiazide (HYZAAR) 100-25 MG per tablet; TAKE 1 TABLET BY MOUTH DAILY.  Dispense: 30 tablet; Refill: 5 - CMP14+EGFR  5. Gastroesophageal reflux disease without esophagitis Avoid caffeine - omeprazole (PRILOSEC) 40 MG capsule; Take 1 capsule (40 mg total) by mouth daily.  Dispense: 30 capsule; Refill: 5  6. Diverticulosis of large intestine without hemorrhage Diet with no seeds or vegetables with skin  7. Simple chronic bronchitis  8. Coronary artery disease involving native coronary artery of native heart without angina pectoris - diltiazem (CARDIZEM CD) 240 MG 24 hr capsule; TAKE 1 CAPSULE (240 MG TOTAL) BY MOUTH DAILY.  Dispense: 30 capsule; Refill: 5 9. Bil knee pain - referral to ortho 10. Onychomycosis of left great toe - referral to podiatrist  Labs pending Health maintenance reviewed Diet and exercise encouraged Continue all meds Follow up  In 3 month   [place], FNP

## 2014-04-25 NOTE — Patient Instructions (Signed)

## 2014-04-26 LAB — THYROID PANEL WITH TSH
Free Thyroxine Index: 2 (ref 1.2–4.9)
T3 Uptake Ratio: 31 % (ref 24–39)
T4, Total: 6.5 ug/dL (ref 4.5–12.0)
TSH: 2.76 u[IU]/mL (ref 0.450–4.500)

## 2014-04-26 LAB — CMP14+EGFR
ALT: 11 IU/L (ref 0–32)
AST: 15 IU/L (ref 0–40)
Albumin/Globulin Ratio: 1.6 (ref 1.1–2.5)
Albumin: 4.1 g/dL (ref 3.5–4.7)
Alkaline Phosphatase: 24 IU/L — ABNORMAL LOW (ref 39–117)
BUN/Creatinine Ratio: 18 (ref 11–26)
BUN: 18 mg/dL (ref 8–27)
CO2: 24 mmol/L (ref 18–29)
Calcium: 9.4 mg/dL (ref 8.7–10.3)
Chloride: 99 mmol/L (ref 97–108)
Creatinine, Ser: 1.02 mg/dL — ABNORMAL HIGH (ref 0.57–1.00)
GFR calc Af Amer: 56 mL/min/{1.73_m2} — ABNORMAL LOW (ref >59)
GFR calc non Af Amer: 49 mL/min/{1.73_m2} — ABNORMAL LOW (ref >59)
Globulin, Total: 2.5 g/dL (ref 1.5–4.5)
Glucose: 92 mg/dL (ref 65–99)
Potassium: 4.5 mmol/L (ref 3.5–5.2)
Sodium: 141 mmol/L (ref 134–144)
Total Bilirubin: 0.4 mg/dL (ref 0.0–1.2)
Total Protein: 6.6 g/dL (ref 6.0–8.5)

## 2014-04-26 LAB — NMR, LIPOPROFILE
Cholesterol: 207 mg/dL — ABNORMAL HIGH (ref 100–199)
HDL Cholesterol by NMR: 53 mg/dL (ref >39)
HDL Particle Number: 32.5 umol/L (ref >=30.5)
LDL Particle Number: 1574 nmol/L — ABNORMAL HIGH (ref <1000)
LDL Size: 21.3 nm (ref >20.5)
LDLC SERPL CALC-MCNC: 131 mg/dL — ABNORMAL HIGH (ref 0–99)
LP-IR Score: 29 (ref <=45)
Small LDL Particle Number: 513 nmol/L (ref <=527)
Triglycerides by NMR: 114 mg/dL (ref 0–149)

## 2014-05-01 ENCOUNTER — Telehealth: Payer: Self-pay | Admitting: Family Medicine

## 2014-05-01 NOTE — Telephone Encounter (Signed)
Message copied by Azalee CourseFULP, Hakeen Shipes on Wed May 01, 2014  2:04 PM ------      Message from: Bennie PieriniMARTIN, MARY-MARGARET      Created: Fri Apr 26, 2014  6:05 PM       Kidney and liver function stable      Creatine up slightly      LDL particle numbers are some better but still to high- zocor not strong enough to bring down will patient agree to go on lipitor or crestor      Thyroid panel normal      Continue current meds- low fat diet and exercise and recheck in 3 months       ------

## 2014-05-15 ENCOUNTER — Other Ambulatory Visit: Payer: Self-pay | Admitting: Nurse Practitioner

## 2014-08-26 ENCOUNTER — Ambulatory Visit: Payer: Commercial Managed Care - HMO | Attending: Orthopedic Surgery | Admitting: Physical Therapy

## 2014-08-26 DIAGNOSIS — I10 Essential (primary) hypertension: Secondary | ICD-10-CM | POA: Insufficient documentation

## 2014-08-26 DIAGNOSIS — M170 Bilateral primary osteoarthritis of knee: Secondary | ICD-10-CM | POA: Insufficient documentation

## 2014-11-11 ENCOUNTER — Other Ambulatory Visit: Payer: Self-pay | Admitting: Nurse Practitioner

## 2014-12-03 ENCOUNTER — Other Ambulatory Visit: Payer: Self-pay | Admitting: Nurse Practitioner

## 2014-12-09 ENCOUNTER — Other Ambulatory Visit: Payer: Self-pay | Admitting: Nurse Practitioner

## 2015-01-04 ENCOUNTER — Ambulatory Visit (INDEPENDENT_AMBULATORY_CARE_PROVIDER_SITE_OTHER): Payer: Commercial Managed Care - HMO | Admitting: Family

## 2015-01-04 ENCOUNTER — Encounter: Payer: Self-pay | Admitting: Family

## 2015-01-04 VITALS — BP 165/72 | HR 65 | Temp 97.8°F | Ht 64.0 in | Wt 173.0 lb

## 2015-01-04 DIAGNOSIS — M6588 Other synovitis and tenosynovitis, other site: Secondary | ICD-10-CM | POA: Diagnosis not present

## 2015-01-04 DIAGNOSIS — M7750 Other enthesopathy of unspecified foot: Secondary | ICD-10-CM

## 2015-01-04 MED ORDER — KETOROLAC TROMETHAMINE 30 MG/ML IJ SOLN
30.0000 mg | Freq: Once | INTRAMUSCULAR | Status: AC
  Administered 2015-01-04: 30 mg via INTRAMUSCULAR

## 2015-01-04 MED ORDER — METHYLPREDNISOLONE ACETATE 80 MG/ML IJ SUSP
80.0000 mg | Freq: Once | INTRAMUSCULAR | Status: AC
  Administered 2015-01-04: 80 mg via INTRAMUSCULAR

## 2015-01-04 NOTE — Patient Instructions (Addendum)

## 2015-01-04 NOTE — Progress Notes (Signed)
   Subjective:    Patient ID: Nancy Holt, female    DOB: Aug 27, 1922, 81 y.o.   MRN: [id number]  Foot Pain This is a recurrent problem. The current episode started yesterday. The problem occurs constantly. The problem has been unchanged. Associated symptoms include joint swelling. Pertinent negatives include no anorexia, change in bowel habit, chills, congestion, fever, headaches or weakness. The symptoms are aggravated by walking. She has tried rest, NSAIDs and ice for the symptoms. The treatment provided mild relief.      Review of Systems  Constitutional: Negative.  Negative for fever and chills.  HENT: Negative.  Negative for congestion.   Eyes: Negative.   Respiratory: Negative.  Negative for shortness of breath.   Cardiovascular: Negative.  Negative for palpitations.  Gastrointestinal: Negative.  Negative for anorexia and change in bowel habit.  Endocrine: Negative.   Genitourinary: Negative.   Musculoskeletal: Positive for joint swelling.  Neurological: Negative.  Negative for weakness and headaches.  Hematological: Negative.   Psychiatric/Behavioral: Negative.   All other systems reviewed and are negative.      Objective:   Physical Exam  Constitutional: She is oriented to person, place, and time. She appears well-developed and well-nourished. No distress.  HENT:  Head: Normocephalic and atraumatic.  Eyes: Pupils are equal, round, and reactive to light.  Neck: Normal range of motion. Neck supple. No thyromegaly present.  Cardiovascular: Normal rate, regular rhythm, normal heart sounds and intact distal pulses.   No murmur heard. Pulmonary/Chest: Effort normal and breath sounds normal. No respiratory distress. She has no wheezes.  Abdominal: Soft. Bowel sounds are normal. She exhibits no distension. There is no tenderness.  Musculoskeletal: Normal range of motion. She exhibits edema (2+ swelling in left ankle, tenderness with palpation below fifth metatarsal) and  tenderness.  Neurological: She is alert and oriented to person, place, and time. She has normal reflexes. No cranial nerve deficit.  Skin: Skin is warm and dry.  Psychiatric: She has a normal mood and affect. Her behavior is normal. Judgment and thought content normal.  Vitals reviewed.    Blood pressure 165/72, pulse 65, temperature 97.8 F (36.6 C), temperature source Oral, height 5\' 4"  (1.626 m), weight 173 lb (78.472 kg). s     Assessment & Plan:  1. Tendinitis of foot -Rest -Ice -Motrin  With food -Elevate when possible -If not improved- Pt will need x-ray on Monday - ketorolac (TORADOL) 30 MG/ML injection 30 mg; Inject 1 mL (30 mg total) into the muscle once. - methylPREDNISolone acetate (DEPO-MEDROL) injection 80 mg; Inject 1 mL (80 mg total) into the muscle once.  Jannifer Rodney, FNP

## 2015-01-08 ENCOUNTER — Other Ambulatory Visit: Payer: Self-pay | Admitting: Family Medicine

## 2015-01-09 ENCOUNTER — Other Ambulatory Visit: Payer: Self-pay

## 2015-01-09 MED ORDER — LOSARTAN POTASSIUM-HCTZ 100-25 MG PO TABS: 1.0000 | ORAL_TABLET | Freq: Every day | ORAL | Status: DC

## 2015-01-17 ENCOUNTER — Other Ambulatory Visit: Payer: Self-pay

## 2015-01-17 DIAGNOSIS — E034 Atrophy of thyroid (acquired): Secondary | ICD-10-CM

## 2015-01-17 MED ORDER — LEVOTHYROXINE SODIUM 25 MCG PO TABS: ORAL_TABLET | ORAL | Status: DC

## 2015-01-21 ENCOUNTER — Ambulatory Visit (INDEPENDENT_AMBULATORY_CARE_PROVIDER_SITE_OTHER): Payer: Commercial Managed Care - HMO | Admitting: Nurse Practitioner

## 2015-01-21 ENCOUNTER — Ambulatory Visit (INDEPENDENT_AMBULATORY_CARE_PROVIDER_SITE_OTHER): Payer: Commercial Managed Care - HMO

## 2015-01-21 ENCOUNTER — Encounter: Payer: Self-pay | Admitting: Nurse Practitioner

## 2015-01-21 VITALS — BP 142/69 | HR 70 | Temp 96.9°F | Ht 64.0 in | Wt 168.0 lb

## 2015-01-21 DIAGNOSIS — K219 Gastro-esophageal reflux disease without esophagitis: Secondary | ICD-10-CM | POA: Diagnosis not present

## 2015-01-21 DIAGNOSIS — I2510 Atherosclerotic heart disease of native coronary artery without angina pectoris: Secondary | ICD-10-CM

## 2015-01-21 DIAGNOSIS — J410 Simple chronic bronchitis: Secondary | ICD-10-CM

## 2015-01-21 DIAGNOSIS — E038 Other specified hypothyroidism: Secondary | ICD-10-CM | POA: Diagnosis not present

## 2015-01-21 DIAGNOSIS — E785 Hyperlipidemia, unspecified: Secondary | ICD-10-CM

## 2015-01-21 DIAGNOSIS — Z23 Encounter for immunization: Secondary | ICD-10-CM

## 2015-01-21 DIAGNOSIS — E034 Atrophy of thyroid (acquired): Secondary | ICD-10-CM | POA: Diagnosis not present

## 2015-01-21 DIAGNOSIS — I10 Essential (primary) hypertension: Secondary | ICD-10-CM

## 2015-01-21 DIAGNOSIS — I872 Venous insufficiency (chronic) (peripheral): Secondary | ICD-10-CM | POA: Diagnosis not present

## 2015-01-21 LAB — POCT CBC
Granulocyte percent: 79.9 %G (ref 37–80)
HCT, POC: 43.9 % (ref 37.7–47.9)
Hemoglobin: 14.1 g/dL (ref 12.2–16.2)
Lymph, poc: 1.8 (ref 0.6–3.4)
MCH, POC: 27.8 pg (ref 27–31.2)
MCHC: 32.2 g/dL (ref 31.8–35.4)
MCV: 86.4 fL (ref 80–97)
MPV: 9.3 fL (ref 0–99.8)
POC Granulocyte: 9 — AB (ref 2–6.9)
POC LYMPH PERCENT: 15.7 %L (ref 10–50)
Platelet Count, POC: 210 10*3/uL (ref 142–424)
RBC: 5.08 M/uL (ref 4.04–5.48)
RDW, POC: 13.8 %
WBC: 11.3 10*3/uL — AB (ref 4.6–10.2)

## 2015-01-21 NOTE — Patient Instructions (Signed)
Bone Health Our bones do many things. They provide structure, protect organs, anchor muscles, and store calcium. Adequate calcium in your diet and weight-bearing physical activity help build strong bones, improve bone amounts, and may reduce the risk of weakening of bones (osteoporosis) later in life. PEAK BONE MASS By age 79, the average woman has acquired most of her skeletal bone mass. A large decline occurs in older adults which increases the risk of osteoporosis. In women this occurs around the time of menopause. It is important for young girls to reach their peak bone mass in order to maintain bone health throughout life. A person with high bone mass as a young adult will be more likely to have a higher bone mass later in life. Not enough calcium consumption and physical activity early on could result in a failure to achieve optimum bone mass in adulthood. OSTEOPOROSIS Osteoporosis is a disease of the bones. It is defined as low bone mass with deterioration of bone structure. Osteoporosis leads to an increase risk of fractures with falls. These fractures commonly happen in the wrist, hip, and spine. While men and women of all ages and background can develop osteoporosis, some of the risk factors for osteoporosis are:  Female.  White.  Postmenopausal.  Older adults.  Small in body size.  Eating a diet low in calcium.  Physically inactive.  Smoking.  Use of some medications.  Family history. CALCIUM Calcium is a mineral needed by the body for healthy bones, teeth, and proper function of the heart, muscles, and nerves. The body cannot produce calcium so it must be absorbed through food. Good sources of calcium include:  Dairy products (low fat or nonfat milk, cheese, and yogurt).  Dark green leafy vegetables (bok choy and broccoli).  Calcium fortified foods (orange juice, cereal, bread, soy beverages, and tofu products).  Nuts (almonds). Recommended amounts of calcium vary  for individuals. RECOMMENDED CALCIUM INTAKES Age and Amount in mg per day  Children 1 to 3 years / 700 mg  Children 4 to 8 years / 1,000 mg  Children 9 to 13 years / 1,300 mg  Teens 14 to 18 years / 1,300 mg  Adults 19 to 50 years / 1,000 mg  Adult women 51 to 70 years / 1,200 mg  Adults 71 years and older / 1,200 mg  Pregnant and breastfeeding teens / 1,300 mg  Pregnant and breastfeeding adults / 1,000 mg Vitamin D also plays an important role in healthy bone development. Vitamin D helps in the absorption of calcium. WEIGHT-BEARING PHYSICAL ACTIVITY Regular physical activity has many positive health benefits. Benefits include strong bones. Weight-bearing physical activity early in life is important in reaching peak bone mass. Weight-bearing physical activities cause muscles and bones to work against gravity. Some examples of weight bearing physical activities include:  Walking, jogging, or running.  Field Hockey.  Jumping rope.  Dancing.  Soccer.  Tennis or Racquetball.  Stair climbing.  Basketball.  Hiking.  Weight lifting.  Aerobic fitness classes. Including weight-bearing physical activity into an exercise plan is a great way to keep bones healthy. Adults: Engage in at least 30 minutes of moderate physical activity on most, preferably all, days of the week. Children: Engage in at least 60 minutes of moderate physical activity on most, preferably all, days of the week. FOR MORE INFORMATION [place] Department of Agriculture, Center for Nutrition Policy and Promotion: www.cnpp.usda.gov National Osteoporosis Foundation: www.nof.org Document Released: 09/25/2003 Document Revised: 10/30/2012 Document Reviewed: 12/25/2008 Exit[employer] Patient Information   2015 [employer]. This information is not intended to replace advice given to you by your health care provider. Make sure you discuss any questions you have with your health care provider.  

## 2015-01-21 NOTE — Progress Notes (Signed)
Subjective:    Patient ID: Nancy Holt, female    DOB: [birth year], 81 y.o.   MRN: [id number]  Patient here today for follow up of chronic medical problems. No complaints today-  Hypertension This is a chronic problem. The current episode started more than 1 year ago. The problem is unchanged. The problem is controlled. Pertinent negatives include no chest pain, headaches, neck pain, palpitations or shortness of breath. Risk factors for coronary artery disease include dyslipidemia, female gender, obesity, post-menopausal state and sedentary lifestyle. Past treatments include angiotensin blockers and diuretics. The current treatment provides moderate improvement. Compliance problems include diet and exercise.  Hypertensive end-organ damage includes CAD/MI and a thyroid problem. There is no history of CVA.  Hyperlipidemia This is a chronic problem. The current episode started more than 1 year ago. The problem is controlled. Recent lipid tests were reviewed and are normal. Exacerbating diseases include hypothyroidism and obesity. She has no history of diabetes. Pertinent negatives include no chest pain or shortness of breath. She is currently on no antihyperlipidemic treatment. Risk factors for coronary artery disease include dyslipidemia, hypertension, obesity and post-menopausal.  Thyroid Problem Visit type: hypothyroidism. Patient reports no diaphoresis, diarrhea, fatigue, heat intolerance, palpitations or visual change. Her past medical history is significant for hyperlipidemia. There is no history of diabetes.  Gastrophageal Reflux She reports no chest pain. Pertinent negatives include no fatigue.  CAD On diltiazem- hasn't seen cardiologist in many years COPD Currently on no meds- no recent flare ups GERD On no meds because she has very in frequent flare ups Peripheral insufficiency Frequent swelling which resolves when she elevates her legs    Review of Systems  Constitutional:  Negative for diaphoresis and fatigue.  Respiratory: Negative for shortness of breath.   Cardiovascular: Negative for chest pain and palpitations.  Gastrointestinal: Negative for diarrhea.  Endocrine: Negative for heat intolerance.  Musculoskeletal: Negative for neck pain.  Neurological: Negative for headaches.  All other systems reviewed and are negative.      Objective:   Physical Exam  Constitutional: She is oriented to person, place, and time. She appears well-developed and well-nourished.  HENT:  Nose: Nose normal.  Mouth/Throat: Oropharynx is clear and moist.  Eyes: EOM are normal.  Neck: Trachea normal, normal range of motion and full passive range of motion without pain. Neck supple. No JVD present. Carotid bruit is not present. No thyromegaly present.  Cardiovascular: Normal rate, regular rhythm, normal heart sounds and intact distal pulses.  Exam reveals no gallop and no friction rub.   No murmur heard. Pulmonary/Chest: Effort normal and breath sounds normal.  Abdominal: Soft. Bowel sounds are normal. She exhibits no distension and no mass. There is no tenderness.  Musculoskeletal: Normal range of motion. She exhibits edema (1+ bil lower ext).  Bile knee edema with pain on flexion and extension  Lymphadenopathy:    She has no cervical adenopathy.  Neurological: She is alert and oriented to person, place, and time. She has normal reflexes.  Skin: Skin is warm and dry.  Psychiatric: She has a normal mood and affect. Her behavior is normal. Judgment and thought content normal.    BP 142/69 mmHg  Pulse 70  Temp(Src) 96.9 F (36.1 C) (Oral)  Ht $R'5\' 4"'Jr$  (1.626 m)  Wt 168 lb (76.204 kg)  BMI 28.82 kg/m2  Chest x ray- Mild caridomagally-Preliminary reading by Ronnald Collum, FNP  [facility]   EKG- NSR- Mary-Margaret Hassell Done, FNP       Assessment & Plan:  1. Venous (peripheral) insufficiency Elevate legs when sitting  2. Essential hypertension Do not add salt to diet -  CMP14+EGFR  3. Simple chronic bronchitis  4. Gastroesophageal reflux disease without esophagitis Avoid spicy foods Do not eat 2 hours prior to bedtime  5. Hypothyroidism due to acquired atrophy of thyroid - Thyroid Panel With TSH  6. Hyperlipemia Low fat diet - Lipid panel  7. Coronary artery disease involving native coronary artery of native heart without angina pectoris - DG Chest 2 View; Future - EKG 12-Lead - POCT CBC    Labs pending Health maintenance reviewed Diet and exercise encouraged Continue all meds Follow up  In 3 month   [place], FNP

## 2015-01-21 NOTE — Addendum Note (Signed)
Addended by: Cleda DaubUCKER, Lary Eckardt G on: 01/21/2015 03:56 PM   Modules accepted: Orders

## 2015-01-22 ENCOUNTER — Encounter: Payer: Self-pay | Admitting: *Deleted

## 2015-01-22 LAB — CMP14+EGFR
ALT: 12 IU/L (ref 0–32)
AST: 15 IU/L (ref 0–40)
Albumin/Globulin Ratio: 1.7 (ref 1.1–2.5)
Albumin: 4.5 g/dL (ref 3.2–4.6)
Alkaline Phosphatase: 19 IU/L — ABNORMAL LOW (ref 39–117)
BUN/Creatinine Ratio: 18 (ref 11–26)
BUN: 20 mg/dL (ref 10–36)
Bilirubin Total: 0.5 mg/dL (ref 0.0–1.2)
CO2: 24 mmol/L (ref 18–29)
Calcium: 9.6 mg/dL (ref 8.7–10.3)
Chloride: 91 mmol/L — ABNORMAL LOW (ref 97–108)
Creatinine, Ser: 1.13 mg/dL — ABNORMAL HIGH (ref 0.57–1.00)
GFR calc Af Amer: 49 mL/min/{1.73_m2} — ABNORMAL LOW (ref >59)
GFR calc non Af Amer: 43 mL/min/{1.73_m2} — ABNORMAL LOW (ref >59)
Globulin, Total: 2.6 g/dL (ref 1.5–4.5)
Glucose: 104 mg/dL — ABNORMAL HIGH (ref 65–99)
Potassium: 4.6 mmol/L (ref 3.5–5.2)
Sodium: 131 mmol/L — ABNORMAL LOW (ref 134–144)
Total Protein: 7.1 g/dL (ref 6.0–8.5)

## 2015-01-22 LAB — THYROID PANEL WITH TSH
Free Thyroxine Index: 2.3 (ref 1.2–4.9)
T3 Uptake Ratio: 33 % (ref 24–39)
T4, Total: 7 ug/dL (ref 4.5–12.0)
TSH: 3.07 u[IU]/mL (ref 0.450–4.500)

## 2015-01-22 LAB — LIPID PANEL
Chol/HDL Ratio: 2.8 ratio units (ref 0.0–4.4)
Cholesterol, Total: 191 mg/dL (ref 100–199)
HDL: 68 mg/dL (ref >39)
LDL Calculated: 102 mg/dL — ABNORMAL HIGH (ref 0–99)
Triglycerides: 103 mg/dL (ref 0–149)
VLDL Cholesterol Cal: 21 mg/dL (ref 5–40)

## 2015-04-25 ENCOUNTER — Encounter: Payer: Self-pay | Admitting: Nurse Practitioner

## 2015-04-25 ENCOUNTER — Ambulatory Visit (INDEPENDENT_AMBULATORY_CARE_PROVIDER_SITE_OTHER): Payer: Commercial Managed Care - HMO | Admitting: Nurse Practitioner

## 2015-04-25 VITALS — BP 149/70 | HR 74 | Temp 97.5°F | Ht 64.0 in | Wt 170.0 lb

## 2015-04-25 DIAGNOSIS — I872 Venous insufficiency (chronic) (peripheral): Secondary | ICD-10-CM

## 2015-04-25 DIAGNOSIS — I2510 Atherosclerotic heart disease of native coronary artery without angina pectoris: Secondary | ICD-10-CM | POA: Diagnosis not present

## 2015-04-25 DIAGNOSIS — J410 Simple chronic bronchitis: Secondary | ICD-10-CM

## 2015-04-25 DIAGNOSIS — E034 Atrophy of thyroid (acquired): Secondary | ICD-10-CM

## 2015-04-25 DIAGNOSIS — Z6829 Body mass index (BMI) 29.0-29.9, adult: Secondary | ICD-10-CM | POA: Insufficient documentation

## 2015-04-25 DIAGNOSIS — E785 Hyperlipidemia, unspecified: Secondary | ICD-10-CM | POA: Diagnosis not present

## 2015-04-25 DIAGNOSIS — K219 Gastro-esophageal reflux disease without esophagitis: Secondary | ICD-10-CM | POA: Diagnosis not present

## 2015-04-25 DIAGNOSIS — E038 Other specified hypothyroidism: Secondary | ICD-10-CM | POA: Diagnosis not present

## 2015-04-25 DIAGNOSIS — R1310 Dysphagia, unspecified: Secondary | ICD-10-CM

## 2015-04-25 DIAGNOSIS — I10 Essential (primary) hypertension: Secondary | ICD-10-CM

## 2015-04-25 MED ORDER — LOSARTAN POTASSIUM-HCTZ 100-25 MG PO TABS: 1.0000 | ORAL_TABLET | Freq: Every day | ORAL | Status: DC

## 2015-04-25 MED ORDER — DILTIAZEM HCL ER COATED BEADS 240 MG PO CP24: 240.0000 mg | ORAL_CAPSULE | Freq: Every day | ORAL | Status: DC

## 2015-04-25 MED ORDER — LEVOTHYROXINE SODIUM 25 MCG PO TABS: ORAL_TABLET | ORAL | Status: DC

## 2015-04-25 NOTE — Patient Instructions (Signed)
Health Maintenance, Female Adopting a healthy lifestyle and getting preventive care can go a long way to promote health and wellness. Talk with your health care provider about what schedule of regular examinations is right for you. This is a good chance for you to check in with your provider about disease prevention and staying healthy. In between checkups, there are plenty of things you can do on your own. Experts have done a lot of research about which lifestyle changes and preventive measures are most likely to keep you healthy. Ask your health care provider for more information. WEIGHT AND DIET  Eat a healthy diet  Be sure to include plenty of vegetables, fruits, low-fat dairy products, and lean protein.  Do not eat a lot of foods high in solid fats, added sugars, or salt.  Get regular exercise. This is one of the most important things you can do for your health.  Most adults should exercise for at least 150 minutes each week. The exercise should increase your heart rate and make you sweat (moderate-intensity exercise).  Most adults should also do strengthening exercises at least twice a week. This is in addition to the moderate-intensity exercise.  Maintain a healthy weight  Body mass index (BMI) is a measurement that can be used to identify possible weight problems. It estimates body fat based on height and weight. Your health care provider can help determine your BMI and help you achieve or maintain a healthy weight.  For females 20 years of age and older:   A BMI below 18.5 is considered underweight.  A BMI of 18.5 to 24.9 is normal.  A BMI of 25 to 29.9 is considered overweight.  A BMI of 30 and above is considered obese.  Watch levels of cholesterol and blood lipids  You should start having your blood tested for lipids and cholesterol at 79 years of age, then have this test every 5 years.  You may need to have your cholesterol levels checked more often if:  Your lipid  or cholesterol levels are high.  You are older than 79 years of age.  You are at high risk for heart disease.  CANCER SCREENING   Lung Cancer  Lung cancer screening is recommended for adults 55-80 years old who are at high risk for lung cancer because of a history of smoking.  A yearly low-dose CT scan of the lungs is recommended for people who:  Currently smoke.  Have quit within the past 15 years.  Have at least a 30-pack-year history of smoking. A pack year is smoking an average of one pack of cigarettes a day for 1 year.  Yearly screening should continue until it has been 15 years since you quit.  Yearly screening should stop if you develop a health problem that would prevent you from having lung cancer treatment.  Breast Cancer  Practice breast self-awareness. This means understanding how your breasts normally appear and feel.  It also means doing regular breast self-exams. Let your health care provider know about any changes, no matter how small.  If you are in your 20s or 30s, you should have a clinical breast exam (CBE) by a health care provider every 1-3 years as part of a regular health exam.  If you are 40 or older, have a CBE every year. Also consider having a breast X-ray (mammogram) every year.  If you have a family history of breast cancer, talk to your health care provider about genetic screening.  If you   are at high risk for breast cancer, talk to your health care provider about having an MRI and a mammogram every year.  Breast cancer gene (BRCA) assessment is recommended for women who have family members with BRCA-related cancers. BRCA-related cancers include:  Breast.  Ovarian.  Tubal.  Peritoneal cancers.  Results of the assessment will determine the need for genetic counseling and BRCA1 and BRCA2 testing. Cervical Cancer Your health care provider may recommend that you be screened regularly for cancer of the pelvic organs (ovaries, uterus, and  vagina). This screening involves a pelvic examination, including checking for microscopic changes to the surface of your cervix (Pap test). You may be encouraged to have this screening done every 3 years, beginning at age 21.  For women ages 30-65, health care providers may recommend pelvic exams and Pap testing every 3 years, or they may recommend the Pap and pelvic exam, combined with testing for human papilloma virus (HPV), every 5 years. Some types of HPV increase your risk of cervical cancer. Testing for HPV may also be done on women of any age with unclear Pap test results.  Other health care providers may not recommend any screening for nonpregnant women who are considered low risk for pelvic cancer and who do not have symptoms. Ask your health care provider if a screening pelvic exam is right for you.  If you have had past treatment for cervical cancer or a condition that could lead to cancer, you need Pap tests and screening for cancer for at least 20 years after your treatment. If Pap tests have been discontinued, your risk factors (such as having a new sexual partner) need to be reassessed to determine if screening should resume. Some women have medical problems that increase the chance of getting cervical cancer. In these cases, your health care provider may recommend more frequent screening and Pap tests. Colorectal Cancer  This type of cancer can be detected and often prevented.  Routine colorectal cancer screening usually begins at 79 years of age and continues through 79 years of age.  Your health care provider may recommend screening at an earlier age if you have risk factors for colon cancer.  Your health care provider may also recommend using home test kits to check for hidden blood in the stool.  A small camera at the end of a tube can be used to examine your colon directly (sigmoidoscopy or colonoscopy). This is done to check for the earliest forms of colorectal  cancer.  Routine screening usually begins at age 50.  Direct examination of the colon should be repeated every 5-10 years through 79 years of age. However, you may need to be screened more often if early forms of precancerous polyps or small growths are found. Skin Cancer  Check your skin from head to toe regularly.  Tell your health care provider about any new moles or changes in moles, especially if there is a change in a mole's shape or color.  Also tell your health care provider if you have a mole that is larger than the size of a pencil eraser.  Always use sunscreen. Apply sunscreen liberally and repeatedly throughout the day.  Protect yourself by wearing long sleeves, pants, a wide-brimmed hat, and sunglasses whenever you are outside. HEART DISEASE, DIABETES, AND HIGH BLOOD PRESSURE   High blood pressure causes heart disease and increases the risk of stroke. High blood pressure is more likely to develop in:  People who have blood pressure in the high end   of the normal range (130-139/85-89 mm Hg).  People who are overweight or obese.  People who are African American.  If you are 38-23 years of age, have your blood pressure checked every 3-5 years. If you are 61 years of age or older, have your blood pressure checked every year. You should have your blood pressure measured twice--once when you are at a hospital or clinic, and once when you are not at a hospital or clinic. Record the average of the two measurements. To check your blood pressure when you are not at a hospital or clinic, you can use:  An automated blood pressure machine at a pharmacy.  A home blood pressure monitor.  If you are between 45 years and 39 years old, ask your health care provider if you should take aspirin to prevent strokes.  Have regular diabetes screenings. This involves taking a blood sample to check your fasting blood sugar level.  If you are at a normal weight and have a low risk for diabetes,  have this test once every three years after 79 years of age.  If you are overweight and have a high risk for diabetes, consider being tested at a younger age or more often. PREVENTING INFECTION  Hepatitis B  If you have a higher risk for hepatitis B, you should be screened for this virus. You are considered at high risk for hepatitis B if:  You were born in a country where hepatitis B is common. Ask your health care provider which countries are considered high risk.  Your parents were born in a high-risk country, and you have not been immunized against hepatitis B (hepatitis B vaccine).  You have HIV or AIDS.  You use needles to inject street drugs.  You live with someone who has hepatitis B.  You have had sex with someone who has hepatitis B.  You get hemodialysis treatment.  You take certain medicines for conditions, including cancer, organ transplantation, and autoimmune conditions. Hepatitis C  Blood testing is recommended for:  Everyone born from [birth year] through 1963.  Anyone with known risk factors for hepatitis C. Sexually transmitted infections (STIs)  You should be screened for sexually transmitted infections (STIs) including gonorrhea and chlamydia if:  You are sexually active and are younger than 79 years of age.  You are older than 79 years of age and your health care provider tells you that you are at risk for this type of infection.  Your sexual activity has changed since you were last screened and you are at an increased risk for chlamydia or gonorrhea. Ask your health care provider if you are at risk.  If you do not have HIV, but are at risk, it may be recommended that you take a prescription medicine daily to prevent HIV infection. This is called pre-exposure prophylaxis (PrEP). You are considered at risk if:  You are sexually active and do not regularly use condoms or know the HIV status of your partner(s).  You take drugs by injection.  You are sexually  active with a partner who has HIV. Talk with your health care provider about whether you are at high risk of being infected with HIV. If you choose to begin PrEP, you should first be tested for HIV. You should then be tested every 3 months for as long as you are taking PrEP.  PREGNANCY   If you are premenopausal and you may become pregnant, ask your health care provider about preconception counseling.  If you may  become pregnant, take 400 to 800 micrograms (mcg) of folic acid every day.  If you want to prevent pregnancy, talk to your health care provider about birth control (contraception). OSTEOPOROSIS AND MENOPAUSE   Osteoporosis is a disease in which the bones lose minerals and strength with aging. This can result in serious bone fractures. Your risk for osteoporosis can be identified using a bone density scan.  If you are 61 years of age or older, or if you are at risk for osteoporosis and fractures, ask your health care provider if you should be screened.  Ask your health care provider whether you should take a calcium or vitamin D supplement to lower your risk for osteoporosis.  Menopause may have certain physical symptoms and risks.  Hormone replacement therapy may reduce some of these symptoms and risks. Talk to your health care provider about whether hormone replacement therapy is right for you.  HOME CARE INSTRUCTIONS   Schedule regular health, dental, and eye exams.  Stay current with your immunizations.   Do not use any tobacco products including cigarettes, chewing tobacco, or electronic cigarettes.  If you are pregnant, do not drink alcohol.  If you are breastfeeding, limit how much and how often you drink alcohol.  Limit alcohol intake to no more than 1 drink per day for nonpregnant women. One drink equals 12 ounces of beer, 5 ounces of wine, or 1 ounces of hard liquor.  Do not use street drugs.  Do not share needles.  Ask your health care provider for help if  you need support or information about quitting drugs.  Tell your health care provider if you often feel depressed.  Tell your health care provider if you have ever been abused or do not feel safe at home.   This information is not intended to replace advice given to you by your health care provider. Make sure you discuss any questions you have with your health care provider.   Document Released: 01/18/2011 Document Revised: 07/26/2014 Document Reviewed: 06/06/2013 Elsevier Interactive Patient Education [employer].

## 2015-04-25 NOTE — Progress Notes (Signed)
Subjective:    Patient ID: Nancy Holt, female    DOB: 1922/09/04, 81 y.o.   MRN: [id number]  Patient here today for follow up of chronic medical problems. Her only complaint today is trouble swallowing her food- has to drink fluids everytime she takes a bite of food to get it down.   Hypertension This is a chronic problem. The current episode started more than 1 year ago. The problem is unchanged. The problem is controlled. Pertinent negatives include no chest pain, headaches, neck pain, palpitations or shortness of breath. Risk factors for coronary artery disease include dyslipidemia, female gender, obesity, post-menopausal state and sedentary lifestyle. Past treatments include angiotensin blockers and diuretics. The current treatment provides moderate improvement. Compliance problems include diet and exercise.  Hypertensive end-organ damage includes CAD/MI and a thyroid problem. There is no history of CVA.  Hyperlipidemia This is a chronic problem. The current episode started more than 1 year ago. The problem is controlled. Recent lipid tests were reviewed and are normal. Exacerbating diseases include hypothyroidism and obesity. She has no history of diabetes. Pertinent negatives include no chest pain or shortness of breath. She is currently on no antihyperlipidemic treatment. Risk factors for coronary artery disease include dyslipidemia, hypertension, obesity and post-menopausal.  Thyroid Problem Visit type: hypothyroidism. Patient reports no diaphoresis, diarrhea, fatigue, heat intolerance, palpitations or visual change. Her past medical history is significant for hyperlipidemia. There is no history of diabetes.  Gastrophageal Reflux She reports no chest pain. Pertinent negatives include no fatigue.  CAD On diltiazem- hasn't seen cardiologist in many years COPD Currently on no meds- no recent flare ups GERD On no meds because she has very in frequent flare ups Peripheral  insufficiency Frequent swelling which resolves when she elevates her legs    Review of Systems  Constitutional: Negative for diaphoresis and fatigue.  Respiratory: Negative for shortness of breath.   Cardiovascular: Negative for chest pain and palpitations.  Gastrointestinal: Negative for diarrhea.  Endocrine: Negative for heat intolerance.  Musculoskeletal: Negative for neck pain.  Neurological: Negative for headaches.  All other systems reviewed and are negative.      Objective:   Physical Exam  Constitutional: She is oriented to person, place, and time. She appears well-developed and well-nourished.  HENT:  Nose: Nose normal.  Mouth/Throat: Oropharynx is clear and moist.  Eyes: EOM are normal.  Neck: Trachea normal, normal range of motion and full passive range of motion without pain. Neck supple. No JVD present. Carotid bruit is not present. No thyromegaly present.  Cardiovascular: Normal rate, regular rhythm, normal heart sounds and intact distal pulses.  Exam reveals no gallop and no friction rub.   No murmur heard. Pulmonary/Chest: Effort normal and breath sounds normal.  Abdominal: Soft. Bowel sounds are normal. She exhibits no distension and no mass. There is no tenderness.  Musculoskeletal: Normal range of motion. She exhibits edema (1+ bil lower ext).  Bile knee edema with pain on flexion and extension  Lymphadenopathy:    She has no cervical adenopathy.  Neurological: She is alert and oriented to person, place, and time. She has normal reflexes.  Skin: Skin is warm and dry.  Psychiatric: She has a normal mood and affect. Her behavior is normal. Judgment and thought content normal.    BP 149/70 mmHg  Pulse 74  Temp(Src) 97.5 F (36.4 C) (Oral)  Ht 5' 4" (1.626 m)  Wt 170 lb (77.111 kg)  BMI 29.17 kg/m2  Assessment & Plan:  1. Essential hypertension do not add salt - losartan-hydrochlorothiazide (HYZAAR) 100-25 MG tablet; Take 1 tablet by mouth  daily.  Dispense: 30 tablet; Refill: 5 - CMP14+EGFR  2. Venous (peripheral) insufficiency  3. Coronary artery disease involving native coronary artery of native heart without angina pectoris - diltiazem (CARDIZEM CD) 240 MG 24 hr capsule; Take 1 capsule (240 mg total) by mouth daily.  Dispense: 30 capsule; Refill: 5  4. Simple chronic bronchitis (HCC) Avoid cigarette smoke  5. Gastroesophageal reflux disease without esophagitis Avoid spicy foods Do not eat 2 hours prior to bedtime  6. Hypothyroidism due to acquired atrophy of thyroid - levothyroxine (SYNTHROID, LEVOTHROID) 25 MCG tablet; TAKE ONE TABLET BY MOUTH ONE TIME DAILY  Dispense: 30 tablet; Refill: 3  7. Hyperlipemia Low fat diet - Lipid panel  8. BMI 29.0-29.9,adult Discussed diet and exercise for person with BMI >25 Will recheck weight in 3-6 months  9. Dysphagia Chew food really well - Ambulatory referral to Gastroenterology    Labs pending Health maintenance reviewed Diet and exercise encouraged Continue all meds Follow up  In 3 months   Mary-Margaret Martin, FNP     

## 2015-04-26 LAB — LIPID PANEL
Chol/HDL Ratio: 4 ratio units (ref 0.0–4.4)
Cholesterol, Total: 210 mg/dL — ABNORMAL HIGH (ref 100–199)
HDL: 53 mg/dL (ref >39)
LDL Calculated: 129 mg/dL — ABNORMAL HIGH (ref 0–99)
Triglycerides: 142 mg/dL (ref 0–149)
VLDL Cholesterol Cal: 28 mg/dL (ref 5–40)

## 2015-04-26 LAB — CMP14+EGFR
ALT: 10 IU/L (ref 0–32)
AST: 16 IU/L (ref 0–40)
Albumin/Globulin Ratio: 1.6 (ref 1.1–2.5)
Albumin: 4.2 g/dL (ref 3.2–4.6)
Alkaline Phosphatase: 23 IU/L — ABNORMAL LOW (ref 39–117)
BUN/Creatinine Ratio: 14 (ref 11–26)
BUN: 14 mg/dL (ref 10–36)
Bilirubin Total: 0.4 mg/dL (ref 0.0–1.2)
CO2: 25 mmol/L (ref 18–29)
Calcium: 9.8 mg/dL (ref 8.7–10.3)
Chloride: 96 mmol/L — ABNORMAL LOW (ref 97–108)
Creatinine, Ser: 1 mg/dL (ref 0.57–1.00)
GFR calc Af Amer: 57 mL/min/{1.73_m2} — ABNORMAL LOW (ref >59)
GFR calc non Af Amer: 50 mL/min/{1.73_m2} — ABNORMAL LOW (ref >59)
Globulin, Total: 2.7 g/dL (ref 1.5–4.5)
Glucose: 100 mg/dL — ABNORMAL HIGH (ref 65–99)
Potassium: 4.4 mmol/L (ref 3.5–5.2)
Sodium: 138 mmol/L (ref 134–144)
Total Protein: 6.9 g/dL (ref 6.0–8.5)

## 2015-05-01 ENCOUNTER — Encounter: Payer: Self-pay | Admitting: Gastroenterology

## 2015-07-01 ENCOUNTER — Ambulatory Visit: Payer: Self-pay | Admitting: Gastroenterology

## 2015-07-15 ENCOUNTER — Other Ambulatory Visit: Payer: Self-pay | Admitting: Family

## 2015-07-24 ENCOUNTER — Ambulatory Visit (INDEPENDENT_AMBULATORY_CARE_PROVIDER_SITE_OTHER): Payer: Medicare Other | Admitting: Gastroenterology

## 2015-07-24 ENCOUNTER — Encounter: Payer: Self-pay | Admitting: Gastroenterology

## 2015-07-24 VITALS — BP 138/80 | HR 72 | Ht 64.0 in | Wt 181.0 lb

## 2015-07-24 DIAGNOSIS — R1310 Dysphagia, unspecified: Secondary | ICD-10-CM

## 2015-07-24 NOTE — Progress Notes (Signed)
Nancy Holt    [id number]    07-15-1923  Primary Care Physician:MARTIN,MARY MARGARET, FNP  Referring Physician: Bennie PieriniMary-Margaret Martin, FNP [address] [place], [place] [id number]  Chief complaint:   Dysphagia  HPI:  82 year old female here with complaints of worsening dysphagia to both solids and liquids for past few years.  She has more difficulty with solid food,  Especially steak.  She tries to chew her food well but  Seemed to have difficulty swallowing any kind of food if she doesn't drink sips of water.  She feels even  Boost gets stuck in her throat and she has to drink some water after.  She never had heartburn but does have intermittent burning sensation in the mouth or tongue with acid taste.  She was taking acid suppression medication in the past but has stopped in the past few years.  Her last endoscopy probably was about 15-20 years ago. Her weight has been stable     Outpatient Encounter Prescriptions as of 07/24/2015  Medication Sig  . Cholecalciferol (VITAMIN D) 2000 UNITS CAPS Take by mouth. One daily    . diltiazem (CARDIZEM CD) 240 MG 24 hr capsule TAKE ONE CAPSULE BY MOUTH ONE TIME DAILY  . Flaxseed, Linseed, (FLAX SEED OIL) 1000 MG CAPS Take by mouth. 3 daily     . levothyroxine (SYNTHROID, LEVOTHROID) 25 MCG tablet TAKE ONE TABLET BY MOUTH ONE TIME DAILY  . losartan-hydrochlorothiazide (HYZAAR) 100-25 MG tablet Take 1 tablet by mouth daily.   No facility-administered encounter medications on file as of 07/24/2015.    Allergies as of 07/24/2015 - Review Complete 07/24/2015  Allergen Reaction Noted  . Lipitor [atorvastatin calcium]  11/23/2010  . Lisinopril Cough 11/23/2010    Past Medical History  Diagnosis Date  . Hyperlipidemia   . Hypertension   . Thyroid disease   . GERD (gastroesophageal reflux disease)   . COPD (chronic obstructive pulmonary disease) (HCC)   . Diverticulosis     History reviewed. No pertinent past surgical  history.  History reviewed. No pertinent family history.  Social History   Social History  . Marital Status: Divorced    Spouse Name: N/A  . Number of Children: N/A  . Years of Education: N/A   Occupational History  . Not on file.   Social History Main Topics  . Smoking status: Former Games developermoker  . Smokeless tobacco: Never Used  . Alcohol Use: No  . Drug Use: Not on file  . Sexual Activity: Not on file   Other Topics Concern  . Not on file   Social History Narrative      Review of systems: Review of Systems  Constitutional: Negative for fever and chills.  HENT: Negative.   Eyes: Negative for blurred vision.  Respiratory: Negative for cough, shortness of breath and wheezing.   Cardiovascular: Negative for chest pain and palpitations.  Gastrointestinal: as per HPI Genitourinary: Negative for dysuria, urgency, frequency and hematuria.  Musculoskeletal: Negative for myalgias, back pain and joint pain.  Skin: Negative for itching and rash.  Neurological: Negative for dizziness, tremors, focal weakness, seizures and loss of consciousness.  Endo/Heme/Allergies: Negative for environmental allergies.  Psychiatric/Behavioral: Negative for depression, suicidal ideas and hallucinations.  All other systems reviewed and are negative.   Physical Exam: Filed Vitals:   07/24/15 1415  BP: 150/80  Pulse: 72   Gen:      No acute distress HEENT:  EOMI, sclera anicteric  Neck:     No masses; no thyromegaly Lungs:    Clear to auscultation bilaterally; normal respiratory effort CV:         Regular rate and rhythm; no murmurs Abd:      + bowel sounds; soft, non-tender; no palpable masses, no distension Ext:    No edema; adequate peripheral perfusion Skin:      Warm and dry; no rash Neuro: alert and oriented x 3 Psych: normal mood and affect     Assessment and Plan/Recommendations:  82 year old female with history of CAD , COPD, OSA here with complaints of worsening dysphagia to  both liquids and solids  We'll obtain barium swallow first and based on the findings we'll schedule for EGD  We'll hold off starting PPI until after barium swallow and can reevaluate based on the findings  advise patient to avoid meat and bread,  Cut food into small bite size and chew well  return in 1-2 months  K. Scherry Ran , MD [phone] Mon-Fri 8a-5p [phone] after 5p, weekends, holidays

## 2015-07-24 NOTE — Patient Instructions (Addendum)
You have been scheduled for a Bar[facility] Esophogram [facility]1st floor of the hospital) on 08/01/2015 at 11:30am. Please arrive 15 minutes prior to your appointment for registration. Make certain not to have anything to eat or drink 6 hours prior to your test. If you need to reschedule for any reason, please contact radiology at [phone] to do so. __________________________________________________________________ A barium swallow is an examination that concentrates on views of the esophagus. This tends to be a double contrast exam (barium and two liquids which, when combined, create a gas to distend the wall of the oesophagus) or single contrast (non-ionic iodine based). The study is usually tailored to your symptoms so a good history is essential. Attention is paid during the study to the form, structure and configuration of the esophagus, looking for functional disorders (such as aspiration, dysphagia, achalasia, motility and reflux) EXAMINATION You may be asked to change into a gown, depending on the type of swallow being performed. A radiologist and radiographer will perform the procedure. The radiologist will advise you of the type of contrast selected for your procedure and direct you during the exam. You will be asked to stand, sit or lie in several different positions and to hold a small amount of fluid in your mouth before being asked to swallow while the imaging is performed .In some instances you may be asked to swallow barium coated marshmallows to assess the motility of a solid food bolus. The exam can be recorded as a digital or video fluoroscopy procedure. POST PROCEDURE It will take 1-2 days for the barium to pass through your system. To facilitate this, it is important, unless otherwise directed, to increase your fluids for the next 24-48hrs and to resume your normal diet.  This test typically takes about 30 minutes to  perform. __________________________________________________________________________________  Please follow up with your Primary Care Doctor about your blood pressure

## 2015-08-01 ENCOUNTER — Ambulatory Visit (HOSPITAL_COMMUNITY)
Admission: RE | Admit: 2015-08-01 | Discharge: 2015-08-01 | Disposition: A | Discharge status: Home / Self Care | Payer: Medicare Other | Source: Ambulatory Visit | Attending: Gastroenterology | Admitting: Gastroenterology

## 2015-08-01 DIAGNOSIS — K224 Dyskinesia of esophagus: Secondary | ICD-10-CM | POA: Insufficient documentation

## 2015-08-01 DIAGNOSIS — R1310 Dysphagia, unspecified: Secondary | ICD-10-CM | POA: Diagnosis not present

## 2015-08-04 ENCOUNTER — Telehealth: Payer: Self-pay | Admitting: Nurse Practitioner

## 2015-08-15 ENCOUNTER — Telehealth: Payer: Self-pay | Admitting: Gastroenterology

## 2015-08-15 NOTE — Telephone Encounter (Signed)
Discussed the barium swallow. She understands there will not be any therapeutic benefit from an EGD. She understands her instructions.

## 2015-08-19 ENCOUNTER — Ambulatory Visit (INDEPENDENT_AMBULATORY_CARE_PROVIDER_SITE_OTHER): Payer: Medicare Other | Admitting: Nurse Practitioner

## 2015-08-19 ENCOUNTER — Encounter: Payer: Self-pay | Admitting: Nurse Practitioner

## 2015-08-19 VITALS — BP 146/74 | HR 68 | Temp 97.3°F | Ht 64.0 in | Wt 167.0 lb

## 2015-08-19 DIAGNOSIS — I2510 Atherosclerotic heart disease of native coronary artery without angina pectoris: Secondary | ICD-10-CM

## 2015-08-19 DIAGNOSIS — E785 Hyperlipidemia, unspecified: Secondary | ICD-10-CM

## 2015-08-19 DIAGNOSIS — Z6829 Body mass index (BMI) 29.0-29.9, adult: Secondary | ICD-10-CM | POA: Diagnosis not present

## 2015-08-19 DIAGNOSIS — K5730 Diverticulosis of large intestine without perforation or abscess without bleeding: Secondary | ICD-10-CM

## 2015-08-19 DIAGNOSIS — E034 Atrophy of thyroid (acquired): Secondary | ICD-10-CM | POA: Diagnosis not present

## 2015-08-19 DIAGNOSIS — K219 Gastro-esophageal reflux disease without esophagitis: Secondary | ICD-10-CM

## 2015-08-19 DIAGNOSIS — I872 Venous insufficiency (chronic) (peripheral): Secondary | ICD-10-CM

## 2015-08-19 DIAGNOSIS — E038 Other specified hypothyroidism: Secondary | ICD-10-CM | POA: Diagnosis not present

## 2015-08-19 DIAGNOSIS — Z9989 Dependence on other enabling machines and devices: Secondary | ICD-10-CM | POA: Insufficient documentation

## 2015-08-19 DIAGNOSIS — I10 Essential (primary) hypertension: Secondary | ICD-10-CM

## 2015-08-19 DIAGNOSIS — G4733 Obstructive sleep apnea (adult) (pediatric): Secondary | ICD-10-CM | POA: Diagnosis not present

## 2015-08-19 DIAGNOSIS — J410 Simple chronic bronchitis: Secondary | ICD-10-CM | POA: Diagnosis not present

## 2015-08-19 MED ORDER — LOSARTAN POTASSIUM-HCTZ 100-25 MG PO TABS: 1.0000 | ORAL_TABLET | Freq: Every day | ORAL | Status: DC

## 2015-08-19 MED ORDER — DILTIAZEM HCL ER COATED BEADS 240 MG PO CP24: 240.0000 mg | ORAL_CAPSULE | Freq: Every day | ORAL | Status: DC

## 2015-08-19 MED ORDER — LEVOTHYROXINE SODIUM 25 MCG PO TABS: ORAL_TABLET | ORAL | Status: DC

## 2015-08-19 NOTE — Progress Notes (Signed)
Subjective:    Patient ID: Nancy Holt, female    DOB: 11-13-1922, 82 y.o.   MRN: [id number]  Patient here today for follow up of chronic medical problems.  Outpatient Encounter Prescriptions as of 08/19/2015  Medication Sig  . Cholecalciferol (VITAMIN D) 2000 UNITS CAPS Take by mouth. One daily    . diltiazem (CARDIZEM CD) 240 MG 24 hr capsule TAKE ONE CAPSULE BY MOUTH ONE TIME DAILY  . Flaxseed, Linseed, (FLAX SEED OIL) 1000 MG CAPS Take by mouth. 3 daily     . levothyroxine (SYNTHROID, LEVOTHROID) 25 MCG tablet TAKE ONE TABLET BY MOUTH ONE TIME DAILY  . losartan-hydrochlorothiazide (HYZAAR) 100-25 MG tablet Take 1 tablet by mouth daily.   No facility-administered encounter medications on file as of 08/19/2015.     Hypertension This is a chronic problem. The current episode started more than 1 year ago. The problem is unchanged. The problem is controlled. Pertinent negatives include no chest pain, headaches, neck pain, palpitations or shortness of breath. Risk factors for coronary artery disease include dyslipidemia, female gender, obesity, post-menopausal state and sedentary lifestyle. Past treatments include angiotensin blockers and diuretics. The current treatment provides moderate improvement. Compliance problems include diet and exercise.  Hypertensive end-organ damage includes CAD/MI and a thyroid problem. There is no history of CVA.  Hyperlipidemia This is a chronic problem. The current episode started more than 1 year ago. The problem is controlled. Recent lipid tests were reviewed and are normal. Exacerbating diseases include hypothyroidism and obesity. She has no history of diabetes. Pertinent negatives include no chest pain or shortness of breath. She is currently on no antihyperlipidemic treatment. Risk factors for coronary artery disease include dyslipidemia, hypertension, obesity and post-menopausal.  Thyroid Problem Visit type: hypothyroidism. Patient reports no  diaphoresis, diarrhea, fatigue, heat intolerance, palpitations or visual change. Her past medical history is significant for hyperlipidemia. There is no history of diabetes.  Gastroesophageal Reflux She reports no chest pain. Pertinent negatives include no fatigue.  CAD On diltiazem- hasn't seen cardiologist in many years COPD Currently on no meds- no recent flare ups GERD On no meds because she has very in frequent flare ups Peripheral insufficiency Frequent swelling which resolves when she elevates her legs OSA- treated with CPAP Wears her CPAP nightly- says that she feels rested in the mornings Diverticulosis Has had no problems or flare ups in several years    Review of Systems  Constitutional: Negative for diaphoresis and fatigue.  Respiratory: Negative for shortness of breath.   Cardiovascular: Negative for chest pain and palpitations.  Gastrointestinal: Negative for diarrhea.  Endocrine: Negative for heat intolerance.  Musculoskeletal: Negative for neck pain.  Neurological: Negative for headaches.  All other systems reviewed and are negative.      Objective:   Physical Exam  Constitutional: She is oriented to person, place, and time. She appears well-developed and well-nourished.  HENT:  Nose: Nose normal.  Mouth/Throat: Oropharynx is clear and moist.  Eyes: EOM are normal.  Neck: Trachea normal, normal range of motion and full passive range of motion without pain. Neck supple. No JVD present. Carotid bruit is not present. No thyromegaly present.  Cardiovascular: Normal rate, regular rhythm, normal heart sounds and intact distal pulses.  Exam reveals no gallop and no friction rub.   No murmur heard. Pulmonary/Chest: Effort normal and breath sounds normal.  Abdominal: Soft. Bowel sounds are normal. She exhibits no distension and no mass. There is no tenderness.  Musculoskeletal: Normal range of motion. She exhibits  edema (1+ bil lower ext).  Bile knee edema with pain  on flexion and extension  Lymphadenopathy:    She has no cervical adenopathy.  Neurological: She is alert and oriented to person, place, and time. She has normal reflexes.  Skin: Skin is warm and dry.  Psychiatric: She has a normal mood and affect. Her behavior is normal. Judgment and thought content normal.    BP 146/74 mmHg  Pulse 68  Temp(Src) 97.3 F (36.3 C) (Oral)  Ht '5\' 4"'$  (1.626 m)  Wt 167 lb (75.751 kg)  BMI 28.65 kg/m2       Assessment & Plan:  1. Essential hypertension Do no t add salt to diet - losartan-hydrochlorothiazide (HYZAAR) 100-25 MG tablet; Take 1 tablet by mouth daily.  Dispense: 30 tablet; Refill: 5 - diltiazem (CARDIZEM CD) 240 MG 24 hr capsule; Take 1 capsule (240 mg total) by mouth daily.  Dispense: 30 capsule; Refill: 5 - CMP14+EGFR  2. Venous (peripheral) insufficiency Elevate legs when sitting  3. Coronary artery disease involving native coronary artery of native heart without angina pectoris  4. Simple chronic bronchitis ([place])  5. Gastroesophageal reflux disease without esophagitis Avoid spicy foods Do not eat 2 hours prior to bedtime   6. OSA on CPAP Continue to wear CPAP  7. Diverticulosis of large intestine without hemorrhage Watch diet- nothing with small seeds or skin  8. Hypothyroidism due to acquired atrophy of thyroid - levothyroxine (SYNTHROID, LEVOTHROID) 25 MCG tablet; TAKE ONE TABLET BY MOUTH ONE TIME DAILY  Dispense: 30 tablet; Refill: 5 - Thyroid Panel With TSH  9. Hyperlipemia Low fat diet - Lipid panel  10. BMI 29.0-29.9,adult Discussed diet and exercise for person with BMI >25 Will recheck weight in 3-6 months      Labs pending Health maintenance reviewed Diet and exercise encouraged Continue all meds Follow up  In 3 months   [place], FNP

## 2015-08-19 NOTE — Patient Instructions (Signed)
Fall Prevention in the Home  Falls can cause injuries and can affect people from all age groups. There are many simple things that you can do to make your home safe and to help prevent falls. WHAT CAN I DO ON THE OUTSIDE OF MY HOME?  Regularly repair the edges of walkways and driveways and fix any cracks.  Remove high doorway thresholds.  Trim any shrubbery on the main path into your home.  Use bright outdoor lighting.  Clear walkways of debris and clutter, including tools and rocks.  Regularly check that handrails are securely fastened and in good repair. Both sides of any steps should have handrails.  Install guardrails along the edges of any raised decks or porches.  Have leaves, snow, and ice cleared regularly.  Use sand or salt on walkways during winter months.  In the garage, clean up any spills right away, including grease or oil spills. WHAT CAN I DO IN THE BATHROOM?  Use night lights.  Install grab bars by the toilet and in the tub and shower. Do not use towel bars as grab bars.  Use non-skid mats or decals on the floor of the tub or shower.  If you need to sit down while you are in the shower, use a plastic, non-slip stool..  Keep the floor dry. Immediately clean up any water that spills on the floor.  Remove soap buildup in the tub or shower on a regular basis.  Attach bath mats securely with double-sided non-slip rug tape.  Remove throw rugs and other tripping hazards from the floor. WHAT CAN I DO IN THE BEDROOM?  Use night lights.  Make sure that a bedside light is easy to reach.  Do not use oversized bedding that drapes onto the floor.  Have a firm chair that has side arms to use for getting dressed.  Remove throw rugs and other tripping hazards from the floor. WHAT CAN I DO IN THE KITCHEN?   Clean up any spills right away.  Avoid walking on wet floors.  Place frequently used items in easy-to-reach places.  If you need to reach for something  above you, use a sturdy step stool that has a grab bar.  Keep electrical cables out of the way.  Do not use floor polish or wax that makes floors slippery. If you have to use wax, make sure that it is non-skid floor wax.  Remove throw rugs and other tripping hazards from the floor. WHAT CAN I DO IN THE STAIRWAYS?  Do not leave any items on the stairs.  Make sure that there are handrails on both sides of the stairs. Fix handrails that are broken or loose. Make sure that handrails are as long as the stairways.  Check any carpeting to make sure that it is firmly attached to the stairs. Fix any carpet that is loose or worn.  Avoid having throw rugs at the top or bottom of stairways, or secure the rugs with carpet tape to prevent them from moving.  Make sure that you have a light switch at the top of the stairs and the bottom of the stairs. If you do not have them, have them installed. WHAT ARE SOME OTHER FALL PREVENTION TIPS?  Wear closed-toe shoes that fit well and support your feet. Wear shoes that have rubber soles or low heels.  When you use a stepladder, make sure that it is completely opened and that the sides are firmly locked. Have someone hold the ladder while you   are using it. Do not climb a closed stepladder.  Add color or contrast paint or tape to grab bars and handrails in your home. Place contrasting color strips on the first and last steps.  Use mobility aids as needed, such as canes, walkers, scooters, and crutches.  Turn on lights if it is dark. Replace any light bulbs that burn out.  Set up furniture so that there are clear paths. Keep the furniture in the same spot.  Fix any uneven floor surfaces.  Choose a carpet design that does not hide the edge of steps of a stairway.  Be aware of any and all pets.  Review your medicines with your healthcare provider. Some medicines can cause dizziness or changes in blood pressure, which increase your risk of falling. Talk  with your health care provider about other ways that you can decrease your risk of falls. This may include working with a physical therapist or trainer to improve your strength, balance, and endurance.   This information is not intended to replace advice given to you by your health care provider. Make sure you discuss any questions you have with your health care provider.   Document Released: 06/25/2002 Document Revised: 11/19/2014 Document Reviewed: 08/09/2014 Elsevier Interactive Patient Education 2016 [employer]  

## 2015-08-20 LAB — LIPID PANEL
Chol/HDL Ratio: 3.8 ratio units (ref 0.0–4.4)
Cholesterol, Total: 207 mg/dL — ABNORMAL HIGH (ref 100–199)
HDL: 55 mg/dL (ref >39)
LDL Calculated: 132 mg/dL — ABNORMAL HIGH (ref 0–99)
Triglycerides: 98 mg/dL (ref 0–149)
VLDL Cholesterol Cal: 20 mg/dL (ref 5–40)

## 2015-08-20 LAB — CMP14+EGFR
ALT: 14 IU/L (ref 0–32)
AST: 17 IU/L (ref 0–40)
Albumin/Globulin Ratio: 1.8 (ref 1.1–2.5)
Albumin: 4.4 g/dL (ref 3.2–4.6)
Alkaline Phosphatase: 20 IU/L — ABNORMAL LOW (ref 39–117)
BUN/Creatinine Ratio: 21 (ref 11–26)
BUN: 21 mg/dL (ref 10–36)
Bilirubin Total: 0.5 mg/dL (ref 0.0–1.2)
CO2: 26 mmol/L (ref 18–29)
Calcium: 9.9 mg/dL (ref 8.7–10.3)
Chloride: 97 mmol/L (ref 96–106)
Creatinine, Ser: 0.99 mg/dL (ref 0.57–1.00)
GFR calc Af Amer: 58 mL/min/{1.73_m2} — ABNORMAL LOW (ref >59)
GFR calc non Af Amer: 50 mL/min/{1.73_m2} — ABNORMAL LOW (ref >59)
Globulin, Total: 2.5 g/dL (ref 1.5–4.5)
Glucose: 98 mg/dL (ref 65–99)
Potassium: 4.2 mmol/L (ref 3.5–5.2)
Sodium: 138 mmol/L (ref 134–144)
Total Protein: 6.9 g/dL (ref 6.0–8.5)

## 2015-08-20 LAB — THYROID PANEL WITH TSH
Free Thyroxine Index: 1.8 (ref 1.2–4.9)
T3 Uptake Ratio: 27 % (ref 24–39)
T4, Total: 6.8 ug/dL (ref 4.5–12.0)
TSH: 1.46 u[IU]/mL (ref 0.450–4.500)

## 2015-09-04 DIAGNOSIS — M79671 Pain in right foot: Secondary | ICD-10-CM | POA: Diagnosis not present

## 2015-09-04 DIAGNOSIS — M2042 Other hammer toe(s) (acquired), left foot: Secondary | ICD-10-CM | POA: Diagnosis not present

## 2015-09-22 ENCOUNTER — Ambulatory Visit: Payer: Self-pay | Admitting: Gastroenterology

## 2015-10-01 ENCOUNTER — Other Ambulatory Visit: Payer: Self-pay | Admitting: Nurse Practitioner

## 2015-10-01 NOTE — Telephone Encounter (Signed)
Left voicemail to call back to determine which mail order pharmacy patient requests prescriptions to be sent to.

## 2015-10-02 ENCOUNTER — Other Ambulatory Visit: Payer: Self-pay

## 2015-10-02 DIAGNOSIS — I10 Essential (primary) hypertension: Secondary | ICD-10-CM

## 2015-10-02 DIAGNOSIS — E034 Atrophy of thyroid (acquired): Secondary | ICD-10-CM

## 2015-10-06 DIAGNOSIS — H02423 Myogenic ptosis of bilateral eyelids: Secondary | ICD-10-CM | POA: Diagnosis not present

## 2015-10-06 DIAGNOSIS — H01022 Squamous blepharitis right lower eyelid: Secondary | ICD-10-CM | POA: Diagnosis not present

## 2015-10-06 DIAGNOSIS — H25812 Combined forms of age-related cataract, left eye: Secondary | ICD-10-CM | POA: Diagnosis not present

## 2015-10-06 DIAGNOSIS — Z961 Presence of intraocular lens: Secondary | ICD-10-CM | POA: Diagnosis not present

## 2015-10-06 DIAGNOSIS — H01021 Squamous blepharitis right upper eyelid: Secondary | ICD-10-CM | POA: Diagnosis not present

## 2015-10-10 NOTE — Telephone Encounter (Signed)
Pt changed mind, she is staying local

## 2015-11-24 ENCOUNTER — Ambulatory Visit (INDEPENDENT_AMBULATORY_CARE_PROVIDER_SITE_OTHER): Payer: Medicare Other | Admitting: Family Medicine

## 2015-11-24 ENCOUNTER — Encounter (INDEPENDENT_AMBULATORY_CARE_PROVIDER_SITE_OTHER): Payer: Self-pay

## 2015-11-24 ENCOUNTER — Encounter: Payer: Self-pay | Admitting: Family Medicine

## 2015-11-24 VITALS — BP 150/65 | HR 70 | Temp 97.2°F | Ht 64.0 in | Wt 172.0 lb

## 2015-11-24 DIAGNOSIS — H9193 Unspecified hearing loss, bilateral: Secondary | ICD-10-CM

## 2015-11-24 NOTE — Progress Notes (Signed)
   HPI  Patient presents today here with concern for cerumen impaction. She explains that she's had the feeling of stopped up ears and decreased hearing for years She's had years evaluated and hearing evaluated by cardiology who recommended using a hearing aid  She recently had several friends who had cerumen removal and felt much better, she would like the same thing.  She denies any shortness of breath, chest pain, or other concerns today.   PMH: Smoking status noted ROS: Per HPI  Objective: BP 150/65 mmHg  Pulse 70  Temp(Src) 97.2 F (36.2 C) (Oral)  Ht 5\' 4"  (1.626 m)  Wt 172 lb (78.019 kg)  BMI 29.51 kg/m2 Gen: NAD, alert, cooperative with exam HEENT: NCAT, TMs normal bilaterally with minimal cerumen in bilateral ear canals CV: RRR, good S1/S2, no murmur Resp: CTABL, no wheezes, non-labored Ext: No edema, warm Neuro: Alert and oriented, No gross deficits  Assessment and plan:  # Decreased hearing Recommended and offered evaluation by audiology, it's been more than 10 years since her last check She would like to watch and wait No signs of cerumen impaction today Reassurance provided, welcomed her to return with any other concerns   Murtis SinkSam Bradshaw, MD [facility] 11/24/2015, 2:18 PM

## 2016-03-07 ENCOUNTER — Other Ambulatory Visit: Payer: Self-pay | Admitting: Nurse Practitioner

## 2016-03-07 DIAGNOSIS — I10 Essential (primary) hypertension: Secondary | ICD-10-CM

## 2016-03-07 DIAGNOSIS — E034 Atrophy of thyroid (acquired): Secondary | ICD-10-CM

## 2016-03-11 ENCOUNTER — Other Ambulatory Visit: Payer: Self-pay | Admitting: *Deleted

## 2016-03-11 DIAGNOSIS — I10 Essential (primary) hypertension: Secondary | ICD-10-CM

## 2016-03-11 DIAGNOSIS — E034 Atrophy of thyroid (acquired): Secondary | ICD-10-CM

## 2016-03-11 MED ORDER — LEVOTHYROXINE SODIUM 25 MCG PO TABS: 25.0000 ug | ORAL_TABLET | Freq: Every day | ORAL | 1 refills | Status: DC

## 2016-03-11 MED ORDER — DILTIAZEM HCL ER COATED BEADS 240 MG PO CP24: 240.0000 mg | ORAL_CAPSULE | Freq: Every day | ORAL | 1 refills | Status: DC

## 2016-04-12 ENCOUNTER — Ambulatory Visit (INDEPENDENT_AMBULATORY_CARE_PROVIDER_SITE_OTHER): Payer: Medicare Other | Admitting: Nurse Practitioner

## 2016-04-12 ENCOUNTER — Encounter: Payer: Self-pay | Admitting: Nurse Practitioner

## 2016-04-12 VITALS — BP 167/73 | HR 76 | Temp 97.1°F | Ht 64.0 in | Wt 171.0 lb

## 2016-04-12 DIAGNOSIS — J069 Acute upper respiratory infection, unspecified: Secondary | ICD-10-CM | POA: Diagnosis not present

## 2016-04-12 MED ORDER — AMOXICILLIN 875 MG PO TABS: 875.0000 mg | ORAL_TABLET | Freq: Two times a day (BID) | ORAL | 0 refills | Status: DC

## 2016-04-12 MED ORDER — BENZONATATE 100 MG PO CAPS: 100.0000 mg | ORAL_CAPSULE | Freq: Three times a day (TID) | ORAL | 0 refills | Status: DC | PRN

## 2016-04-12 NOTE — Progress Notes (Signed)
Subjective:     Nancy Holt is a 82 y.o. female who presents for evaluation of sinus pain. Symptoms include: congestion, cough, nasal congestion and post nasal drip. Onset of symptoms was 1 week ago. Symptoms have been gradually worsening since that time. Past history is significant for no history of pneumonia or bronchitis. Patient is a non-smoker.  The following portions of the patient's history were reviewed and updated as appropriate: allergies, current medications, past family history, past medical history, past social history, past surgical history and problem list.  Review of Systems Pertinent items noted in HPI and remainder of comprehensive ROS otherwise negative.   Objective:    BP (!) 167/73   Pulse 76   Temp 97.1 F (36.2 C) (Oral)   Ht 5\' 4"  (1.626 m)   Wt 171 lb (77.6 kg)   BMI 29.35 kg/m  General appearance: alert and cooperative Eyes: conjunctivae/corneas clear. PERRL, EOM's intact. Fundi benign. Ears: normal TM's and external ear canals both ears Nose: clear discharge, moderate congestion, turbinates pale, no sinus tenderness Throat: lips, mucosa, and tongue normal; teeth and gums normal Lungs: clear to auscultation bilaterally and deep dry cough Heart: regular rate and rhythm, S1, S2 normal, no murmur, click, rub or gallop    Assessment:    Acute bacterial upper resp infection.    Plan:   1. Take meds as prescribed 2. Use a cool mist humidifier especially during the winter months and when heat has been humid. 3. Use saline nose sprays frequently 4. Saline irrigations of the nose can be very helpful if done frequently.  * 4X daily for 1 week*  * Use of a nettie pot can be helpful with this. Follow directions with this* 5. Drink plenty of fluids 6. Keep thermostat turn down low 7.For any cough or congestion  Use plain Mucinex- regular strength or max strength is fine   * Children- consult with Pharmacist for dosing 8. For fever or aces or pains- take  tylenol or ibuprofen appropriate for age and weight.  * for fevers greater than 101 orally you may alternate ibuprofen and tylenol every  3 hours.   Meds ordered this encounter  Medications  . amoxicillin (AMOXIL) 875 MG tablet    Sig: Take 1 tablet (875 mg total) by mouth 2 (two) times daily. 1 po BID    Dispense:  20 tablet    Refill:  0    Order Specific Question:   Supervising Provider    Answer:   VINCENT, CAROL L [[id number]]  . benzonatate (TESSALON PERLES) 100 MG capsule    Sig: Take 1 capsule (100 mg total) by mouth 3 (three) times daily as needed for cough.    Dispense:  20 capsule    Refill:  0    Order Specific Question:   Supervising Provider    Answer:   Johna SheriffVINCENT, CAROL L [[id number]]   Mary-Margaret Daphine DeutscherMartin, FNP

## 2016-04-12 NOTE — Patient Instructions (Signed)

## 2016-04-13 ENCOUNTER — Telehealth: Payer: Self-pay | Admitting: Nurse Practitioner

## 2016-04-13 NOTE — Telephone Encounter (Signed)
Patient called stating that she starting taking amoxicillin and tessalon pearls.  Patient states that the cough medication is making her cough.  Informed patient to quit taking cough medication and continue with antibiotic.

## 2016-08-28 ENCOUNTER — Other Ambulatory Visit: Payer: Self-pay | Admitting: Nurse Practitioner

## 2016-08-28 DIAGNOSIS — I10 Essential (primary) hypertension: Secondary | ICD-10-CM

## 2016-08-31 ENCOUNTER — Other Ambulatory Visit: Payer: Self-pay | Admitting: Nurse Practitioner

## 2016-08-31 DIAGNOSIS — E034 Atrophy of thyroid (acquired): Secondary | ICD-10-CM

## 2016-08-31 DIAGNOSIS — I10 Essential (primary) hypertension: Secondary | ICD-10-CM

## 2016-09-01 NOTE — Telephone Encounter (Signed)
All labs done 07/2015

## 2016-09-02 ENCOUNTER — Other Ambulatory Visit: Payer: Self-pay | Admitting: Nurse Practitioner

## 2016-09-02 DIAGNOSIS — I10 Essential (primary) hypertension: Secondary | ICD-10-CM

## 2016-09-29 ENCOUNTER — Other Ambulatory Visit: Payer: Self-pay | Admitting: Pediatrics

## 2016-09-29 DIAGNOSIS — E034 Atrophy of thyroid (acquired): Secondary | ICD-10-CM

## 2016-12-02 ENCOUNTER — Other Ambulatory Visit: Payer: Self-pay | Admitting: Nurse Practitioner

## 2016-12-02 DIAGNOSIS — I10 Essential (primary) hypertension: Secondary | ICD-10-CM

## 2016-12-02 NOTE — Telephone Encounter (Signed)
Last refill without being seen 

## 2017-02-25 ENCOUNTER — Other Ambulatory Visit: Payer: Self-pay | Admitting: Nurse Practitioner

## 2017-02-25 DIAGNOSIS — I10 Essential (primary) hypertension: Secondary | ICD-10-CM

## 2017-02-25 NOTE — Telephone Encounter (Signed)
LAst seen 04/12/16  MMM

## 2017-02-25 NOTE — Telephone Encounter (Signed)
Last refill without being seen 

## 2017-03-10 ENCOUNTER — Other Ambulatory Visit: Payer: Self-pay | Admitting: Nurse Practitioner

## 2017-03-10 DIAGNOSIS — I10 Essential (primary) hypertension: Secondary | ICD-10-CM

## 2017-03-10 NOTE — Telephone Encounter (Signed)
Last refill without being seen 

## 2017-05-27 ENCOUNTER — Other Ambulatory Visit: Payer: Self-pay | Admitting: Nurse Practitioner

## 2017-05-27 DIAGNOSIS — I10 Essential (primary) hypertension: Secondary | ICD-10-CM

## 2017-05-27 NOTE — Telephone Encounter (Signed)
Last seen 04/12/16

## 2017-05-30 NOTE — Telephone Encounter (Signed)
Last refill without being seen 

## 2017-06-07 ENCOUNTER — Other Ambulatory Visit: Payer: Self-pay | Admitting: Nurse Practitioner

## 2017-06-07 DIAGNOSIS — I10 Essential (primary) hypertension: Secondary | ICD-10-CM

## 2017-06-08 ENCOUNTER — Telehealth: Payer: Self-pay | Admitting: Nurse Practitioner

## 2017-06-16 ENCOUNTER — Ambulatory Visit: Payer: Self-pay | Admitting: Nurse Practitioner

## 2017-06-24 ENCOUNTER — Ambulatory Visit (INDEPENDENT_AMBULATORY_CARE_PROVIDER_SITE_OTHER): Payer: Medicare Other | Admitting: Nurse Practitioner

## 2017-06-24 ENCOUNTER — Encounter: Payer: Self-pay | Admitting: Nurse Practitioner

## 2017-06-24 VITALS — BP 144/71 | HR 73 | Temp 97.1°F | Ht 64.0 in | Wt 168.0 lb

## 2017-06-24 DIAGNOSIS — I872 Venous insufficiency (chronic) (peripheral): Secondary | ICD-10-CM | POA: Diagnosis not present

## 2017-06-24 DIAGNOSIS — Z9989 Dependence on other enabling machines and devices: Secondary | ICD-10-CM

## 2017-06-24 DIAGNOSIS — I10 Essential (primary) hypertension: Secondary | ICD-10-CM

## 2017-06-24 DIAGNOSIS — J410 Simple chronic bronchitis: Secondary | ICD-10-CM | POA: Diagnosis not present

## 2017-06-24 DIAGNOSIS — E034 Atrophy of thyroid (acquired): Secondary | ICD-10-CM | POA: Diagnosis not present

## 2017-06-24 DIAGNOSIS — E782 Mixed hyperlipidemia: Secondary | ICD-10-CM | POA: Diagnosis not present

## 2017-06-24 DIAGNOSIS — K219 Gastro-esophageal reflux disease without esophagitis: Secondary | ICD-10-CM | POA: Diagnosis not present

## 2017-06-24 DIAGNOSIS — Z6829 Body mass index (BMI) 29.0-29.9, adult: Secondary | ICD-10-CM

## 2017-06-24 DIAGNOSIS — G4733 Obstructive sleep apnea (adult) (pediatric): Secondary | ICD-10-CM

## 2017-06-24 DIAGNOSIS — K5730 Diverticulosis of large intestine without perforation or abscess without bleeding: Secondary | ICD-10-CM

## 2017-06-24 DIAGNOSIS — I2510 Atherosclerotic heart disease of native coronary artery without angina pectoris: Secondary | ICD-10-CM | POA: Diagnosis not present

## 2017-06-24 MED ORDER — DILTIAZEM HCL ER COATED BEADS 240 MG PO CP24: ORAL_CAPSULE | ORAL | 1 refills | Status: DC

## 2017-06-24 MED ORDER — LOSARTAN POTASSIUM-HCTZ 100-25 MG PO TABS: 1.0000 | ORAL_TABLET | Freq: Every day | ORAL | 1 refills | Status: DC

## 2017-06-24 MED ORDER — LEVOTHYROXINE SODIUM 25 MCG PO TABS: 25.0000 ug | ORAL_TABLET | Freq: Every morning | ORAL | 11 refills | Status: DC

## 2017-06-24 NOTE — Progress Notes (Signed)
Subjective:    Patient ID: Nancy PouMarilyn L Letterman, female    DOB: 1922/11/26, 83 y.o.   MRN: [id number]  HPI Nancy Holt is here today for follow up of chronic medical problem.  Outpatient Encounter Medications as of 06/24/2017  Medication Sig  . Cholecalciferol (VITAMIN D) 2000 UNITS CAPS Take by mouth. One daily    . diltiazem (CARDIZEM CD) 240 MG 24 hr capsule TAKE 1 CAPSULE BY MOUTH EVERY DAY  . levothyroxine (SYNTHROID, LEVOTHROID) 25 MCG tablet TAKE 1 TABLET EVERY MORNING  . losartan-hydrochlorothiazide (HYZAAR) 100-25 MG tablet TAKE 1 TABLET BY MOUTH EVERY DAY  . amoxicillin (AMOXIL) 875 MG tablet Take 1 tablet (875 mg total) by mouth 2 (two) times daily. 1 po BID  . benzonatate (TESSALON PERLES) 100 MG capsule Take 1 capsule (100 mg total) by mouth 3 (three) times daily as needed for cough.  . Flaxseed, Linseed, (FLAX SEED OIL) 1000 MG CAPS Take by mouth. 3 daily        1. Essential hypertension  No c/o chest pain, sob or headache. Does ot check blood pressure at home. BP Readings from Last 3 Encounters:  06/24/17 (!) 144/71  04/12/16 (!) 167/73  11/24/15 (!) 150/65     2. Venous (peripheral) insufficiency  Occasionally lower ext edema  3. Coronary artery disease involving native coronary artery of native heart without angina pectoris does not see cardiology  4. Simple chronic bronchitis (HCC)  currenlty doing well. No cough  5. OSA on CPAP  Wears CPAP nightly-feels reste din AM  6. Gastroesophageal reflux disease without esophagitis  Takes otc meds as needed- maybe 1-2 x a week  7. Hypothyroidism due to acquired atrophy of thyroid  No problems that she knows of  8. Mixed hyperlipidemia  Avoids fried foods  9. Diverticulosis of large intestine without hemorrhage  No recent flare ups  10. BMI 29.0-29.9,adult  No change in weight    New complaints: None today  Social history: Lives alone   Review of Systems  Constitutional: Negative for activity change  and appetite change.  HENT: Negative.   Eyes: Negative for pain.  Respiratory: Negative for shortness of breath.   Cardiovascular: Negative for chest pain, palpitations and leg swelling.  Gastrointestinal: Negative for abdominal pain.  Endocrine: Negative for polydipsia.  Genitourinary: Negative.   Skin: Negative for rash.  Neurological: Negative for dizziness, weakness and headaches.  Hematological: Does not bruise/bleed easily.  Psychiatric/Behavioral: Negative.   All other systems reviewed and are negative.      Objective:   Physical Exam  Constitutional: She is oriented to person, place, and time. She appears well-developed and well-nourished.  HENT:  Nose: Nose normal.  Mouth/Throat: Oropharynx is clear and moist.  Eyes: EOM are normal.  Neck: Trachea normal, normal range of motion and full passive range of motion without pain. Neck supple. No JVD present. Carotid bruit is not present. No thyromegaly present.  Cardiovascular: Normal rate, regular rhythm, normal heart sounds and intact distal pulses. Exam reveals no gallop and no friction rub.  No murmur heard. Pulmonary/Chest: Effort normal and breath sounds normal.  Abdominal: Soft. Bowel sounds are normal. She exhibits no distension and no mass. There is no tenderness.  Musculoskeletal: Normal range of motion.  Lymphadenopathy:    She has no cervical adenopathy.  Neurological: She is alert and oriented to person, place, and time. She has normal reflexes.  Skin: Skin is warm and dry.  Psychiatric: She has a normal mood and affect.  Her behavior is normal. Judgment and thought content normal.   BP (!) 144/71   Pulse 73   Temp (!) 97.1 F (36.2 C) (Oral)   Ht 5\' 4"  (1.626 m)   Wt 168 lb (76.2 kg)   BMI 28.84 kg/m       Assessment & Plan:  1. Essential hypertension loiw sodium diet - losartan-hydrochlorothiazide (HYZAAR) 100-25 MG tablet; Take 1 tablet by mouth daily.  Dispense: 90 tablet; Refill: 1 - diltiazem  (CARDIZEM CD) 240 MG 24 hr capsule; TAKE 1 CAPSULE BY MOUTH EVERY DAY  Dispense: 90 capsule; Refill: 1  2. Venous (peripheral) insufficiency Elevate legs when sittinfg  3. Coronary artery disease involving native coronary artery of native heart without angina pectori  4. Simple chronic bronchitis (HCC)  5. OSA on CPAP Continue to wear cpap at night  6. Gastroesophageal reflux disease without esophagitis Avoid spicy foods Do not eat 2 hours prior to bedtime  7. Hypothyroidism due to acquired atrophy of thyroid - levothyroxine (SYNTHROID, LEVOTHROID) 25 MCG tablet; Take 1 tablet (25 mcg total) by mouth every morning.  Dispense: 30 tablet; Refill: 11  8. Mixed hyperlipidemia Low fat diet  9. Diverticulosis of large intestine without hemorrhage Watch diet  10. BMI 29.0-29.9,adult Discussed diet and exercise for person with BMI >25 Will recheck weight in 3-6 months     Labs pending Health maintenance reviewed Diet and exercise encouraged Continue all meds Follow up  In 6 months   Mary-Margaret Daphine DeutscherMartin, FNP

## 2017-06-24 NOTE — Patient Instructions (Signed)

## 2017-06-25 LAB — THYROID PANEL WITH TSH
Free Thyroxine Index: 2.1 (ref 1.2–4.9)
T3 Uptake Ratio: 29 % (ref 24–39)
T4, Total: 7.4 ug/dL (ref 4.5–12.0)
TSH: 3.54 u[IU]/mL (ref 0.450–4.500)

## 2017-09-26 ENCOUNTER — Other Ambulatory Visit: Payer: Self-pay | Admitting: *Deleted

## 2017-09-26 DIAGNOSIS — I10 Essential (primary) hypertension: Secondary | ICD-10-CM

## 2017-09-26 MED ORDER — LOSARTAN POTASSIUM-HCTZ 100-25 MG PO TABS: 1.0000 | ORAL_TABLET | Freq: Every day | ORAL | 0 refills | Status: DC

## 2018-02-22 ENCOUNTER — Other Ambulatory Visit: Payer: Self-pay | Admitting: Nurse Practitioner

## 2018-02-22 DIAGNOSIS — I10 Essential (primary) hypertension: Secondary | ICD-10-CM

## 2018-02-22 NOTE — Telephone Encounter (Signed)
Patient aware.

## 2018-02-22 NOTE — Telephone Encounter (Signed)
Last seen 06/24/17

## 2018-02-22 NOTE — Telephone Encounter (Signed)
Last refill without being seen 

## 2018-03-22 ENCOUNTER — Other Ambulatory Visit: Payer: Self-pay | Admitting: Nurse Practitioner

## 2018-03-22 DIAGNOSIS — I10 Essential (primary) hypertension: Secondary | ICD-10-CM

## 2018-03-23 ENCOUNTER — Telehealth: Payer: Self-pay | Admitting: Nurse Practitioner

## 2018-03-23 DIAGNOSIS — E034 Atrophy of thyroid (acquired): Secondary | ICD-10-CM

## 2018-03-23 DIAGNOSIS — I10 Essential (primary) hypertension: Secondary | ICD-10-CM

## 2018-03-23 MED ORDER — LOSARTAN POTASSIUM-HCTZ 100-25 MG PO TABS: 1.0000 | ORAL_TABLET | Freq: Every day | ORAL | 0 refills | Status: DC

## 2018-03-23 MED ORDER — DILTIAZEM HCL ER COATED BEADS 240 MG PO CP24: 240.0000 mg | ORAL_CAPSULE | Freq: Every day | ORAL | 0 refills | Status: DC

## 2018-03-23 MED ORDER — LEVOTHYROXINE SODIUM 25 MCG PO TABS: 25.0000 ug | ORAL_TABLET | Freq: Every morning | ORAL | 0 refills | Status: DC

## 2018-03-23 NOTE — Telephone Encounter (Signed)
Aware refills sent to pharmacy Appt 04/11/18

## 2018-04-11 ENCOUNTER — Encounter: Payer: Self-pay | Admitting: Nurse Practitioner

## 2018-04-11 ENCOUNTER — Ambulatory Visit (INDEPENDENT_AMBULATORY_CARE_PROVIDER_SITE_OTHER): Payer: Medicare Other | Admitting: Nurse Practitioner

## 2018-04-11 VITALS — BP 153/72 | HR 70 | Temp 97.3°F | Ht 64.0 in | Wt 162.8 lb

## 2018-04-11 DIAGNOSIS — I872 Venous insufficiency (chronic) (peripheral): Secondary | ICD-10-CM

## 2018-04-11 DIAGNOSIS — I10 Essential (primary) hypertension: Secondary | ICD-10-CM | POA: Diagnosis not present

## 2018-04-11 DIAGNOSIS — J410 Simple chronic bronchitis: Secondary | ICD-10-CM | POA: Diagnosis not present

## 2018-04-11 DIAGNOSIS — E782 Mixed hyperlipidemia: Secondary | ICD-10-CM

## 2018-04-11 DIAGNOSIS — K219 Gastro-esophageal reflux disease without esophagitis: Secondary | ICD-10-CM

## 2018-04-11 DIAGNOSIS — G4733 Obstructive sleep apnea (adult) (pediatric): Secondary | ICD-10-CM | POA: Diagnosis not present

## 2018-04-11 DIAGNOSIS — I2510 Atherosclerotic heart disease of native coronary artery without angina pectoris: Secondary | ICD-10-CM | POA: Diagnosis not present

## 2018-04-11 DIAGNOSIS — E034 Atrophy of thyroid (acquired): Secondary | ICD-10-CM

## 2018-04-11 DIAGNOSIS — Z9989 Dependence on other enabling machines and devices: Secondary | ICD-10-CM

## 2018-04-11 DIAGNOSIS — K5790 Diverticulosis of intestine, part unspecified, without perforation or abscess without bleeding: Secondary | ICD-10-CM

## 2018-04-11 DIAGNOSIS — Z6829 Body mass index (BMI) 29.0-29.9, adult: Secondary | ICD-10-CM

## 2018-04-11 MED ORDER — LEVOTHYROXINE SODIUM 25 MCG PO TABS: 25.0000 ug | ORAL_TABLET | Freq: Every morning | ORAL | 1 refills | Status: DC

## 2018-04-11 MED ORDER — LOSARTAN POTASSIUM-HCTZ 100-25 MG PO TABS: 1.0000 | ORAL_TABLET | Freq: Every day | ORAL | 1 refills | Status: DC

## 2018-04-11 MED ORDER — DILTIAZEM HCL ER COATED BEADS 240 MG PO CP24: 240.0000 mg | ORAL_CAPSULE | Freq: Every day | ORAL | 1 refills | Status: DC

## 2018-04-11 NOTE — Patient Instructions (Addendum)

## 2018-04-11 NOTE — Progress Notes (Signed)
Subjective:    Patient ID: Nancy Holt, female    DOB: 07-16-1923, 84 y.o.   MRN: [id number]   Chief Complaint: Medical management of chronic diseases  HPI:  1. Essential hypertension  -does not check BP at home -no c/o CP/SOB/HA -doest watch salt intake  2. Venous (peripheral) insufficiency  -occasional lower extremity edema at nights  3. Coronary artery disease involving native coronary artery of native heart without angina pectoris  -does not see cardiology -no chest pain  4. Simple chronic bronchitis (HCC)  -no cough  5. OSA on CPAP  -does not wear CPAP at night -does have daytime drowsiness -feels like she is not snoring  6. Gastroesophageal reflux disease without esophagitis  -takes OTC meds 1-2x/month as needed  7. Diverticulosis  -no flares  8. Hypothyroidism due to acquired atrophy of thyroid  -no current problems  9. Mixed hyperlipidemia  -does try to  watch fat intake in her diet  10. BMI 29.0-29.9,adult  -no change in weight    Outpatient Encounter Medications as of 04/11/2018  Medication Sig  . amoxicillin (AMOXIL) 875 MG tablet Take 1 tablet (875 mg total) by mouth 2 (two) times daily. 1 po BID  . benzonatate (TESSALON PERLES) 100 MG capsule Take 1 capsule (100 mg total) by mouth 3 (three) times daily as needed for cough.  . Cholecalciferol (VITAMIN D) 2000 UNITS CAPS Take by mouth. One daily    . diltiazem (CARDIZEM CD) 240 MG 24 hr capsule Take 1 capsule (240 mg total) by mouth daily.  . Flaxseed, Linseed, (FLAX SEED OIL) 1000 MG CAPS Take by mouth. 3 daily     . levothyroxine (SYNTHROID, LEVOTHROID) 25 MCG tablet Take 1 tablet (25 mcg total) by mouth every morning.  Marland Kitchen losartan-hydrochlorothiazide (HYZAAR) 100-25 MG tablet Take 1 tablet by mouth daily.   No facility-administered encounter medications on file as of 04/11/2018.       New complaints: none  Social history: Lives alone   Review of Systems  Constitutional: Negative for  activity change, appetite change, chills, fatigue, fever and unexpected weight change.  HENT: Negative for congestion, ear pain, rhinorrhea, sinus pressure, sinus pain and sore throat.   Eyes: Negative for pain, redness and visual disturbance.  Respiratory: Negative for cough, chest tightness, shortness of breath and wheezing.   Cardiovascular: Negative for chest pain, palpitations and leg swelling.  Gastrointestinal: Negative for abdominal pain, constipation, diarrhea, nausea and vomiting.  Endocrine: Negative for cold intolerance, heat intolerance, polydipsia, polyphagia and polyuria.  Genitourinary: Negative for difficulty urinating, dysuria and urgency.  Musculoskeletal: Negative for arthralgias, gait problem, joint swelling and myalgias.  Skin: Negative for rash and wound.  Allergic/Immunologic: Negative for environmental allergies and food allergies.  Neurological: Negative for dizziness, tremors, weakness and numbness.  Hematological: Does not bruise/bleed easily.  Psychiatric/Behavioral: Negative for behavioral problems, confusion, decreased concentration, sleep disturbance and suicidal ideas. The patient is not nervous/anxious.        Objective:   Physical Exam  Constitutional: She is oriented to person, place, and time. She appears well-developed and well-nourished.  HENT:  Head: Normocephalic and atraumatic.  Right Ear: External ear normal.  Left Ear: External ear normal.  Nose: Nose normal.  Mouth/Throat: Oropharynx is clear and moist. No oropharyngeal exudate.  Eyes: Pupils are equal, round, and reactive to light. Conjunctivae and EOM are normal.  Neck: Normal range of motion. Neck supple. No thyromegaly present.  Cardiovascular: Normal rate, regular rhythm, normal heart sounds and intact distal  pulses.  Pulmonary/Chest: Effort normal and breath sounds normal.  Abdominal: Soft. Bowel sounds are normal.  Musculoskeletal: Normal range of motion.  Neurological: She is alert  and oriented to person, place, and time. She displays normal reflexes. No cranial nerve deficit.  Skin: Skin is warm and dry.  Psychiatric: She has a normal mood and affect. Her behavior is normal. Judgment and thought content normal.  Nursing note and vitals reviewed.    BP (!) 153/72   Pulse 70   Temp (!) 97.3 F (36.3 C) (Oral)   Ht '5\' 4"'$  (1.626 m)   Wt 162 lb 12.8 oz (73.8 kg)   BMI 27.94 kg/m       Assessment & Plan:  Nancy Holt comes in today with chief complaint of Hypertension (6 month follow up) and Hyperlipidemia   Diagnosis and orders addressed:  1. Essential hypertension - CMP14+EGFR - losartan-hydrochlorothiazide (HYZAAR) 100-25 MG tablet; Take 1 tablet by mouth daily.  Dispense: 90 tablet; Refill: 1  - diltiazem (CARDIZEM CD) 240 MG 24 hr capsule; Take 1 capsule (240 mg total) by mouth daily.  Dispense: 90 capsule; Refill: 1 -continue low salt diet  2. Venous (peripheral) insufficiency -compression hose -elevate legs when sitting  3. Coronary artery disease involving native coronary artery of native heart without angina pectoris  4. Simple chronic bronchitis ([place]) -avoid triggers  5. OSA on CPAP  6. Gastroesophageal reflux disease without esophagitis -continue OTC Tums as needed -avoid spicy foods -stop eating 2-3 hours before bedtime  7. Diverticulosis watch diet to prevent flare up  8. Hypothyroidism due to acquired atrophy of thyroid - Thyroid Panel With TSH - levothyroxine (SYNTHROID, LEVOTHROID) 25 MCG tablet; Take 1 tablet (25 mcg total) by mouth every morning.  Dispense: 90 tablet; Refill: 2  9. Mixed hyperlipidemia - Lipid panel -low fat diet  10. BMI 29.0-29.9,adult -encouraged dietary changes and exercise   Labs pending Health Maintenance reviewed Diet and exercise encouraged  Follow up plan: 6 months   [place], FNP

## 2018-04-12 LAB — CMP14+EGFR
ALT: 11 IU/L (ref 0–32)
AST: 14 IU/L (ref 0–40)
Albumin/Globulin Ratio: 1.9 (ref 1.2–2.2)
Albumin: 4.5 g/dL (ref 3.2–4.6)
Alkaline Phosphatase: 21 IU/L — ABNORMAL LOW (ref 39–117)
BUN/Creatinine Ratio: 20 (ref 12–28)
BUN: 21 mg/dL (ref 10–36)
Bilirubin Total: 0.4 mg/dL (ref 0.0–1.2)
CO2: 24 mmol/L (ref 20–29)
Calcium: 9.8 mg/dL (ref 8.7–10.3)
Chloride: 97 mmol/L (ref 96–106)
Creatinine, Ser: 1.03 mg/dL — ABNORMAL HIGH (ref 0.57–1.00)
GFR calc Af Amer: 54 mL/min/{1.73_m2} — ABNORMAL LOW (ref >59)
GFR calc non Af Amer: 47 mL/min/{1.73_m2} — ABNORMAL LOW (ref >59)
Globulin, Total: 2.4 g/dL (ref 1.5–4.5)
Glucose: 89 mg/dL (ref 65–99)
Potassium: 4.5 mmol/L (ref 3.5–5.2)
Sodium: 137 mmol/L (ref 134–144)
Total Protein: 6.9 g/dL (ref 6.0–8.5)

## 2018-04-12 LAB — LIPID PANEL
Chol/HDL Ratio: 3.6 ratio (ref 0.0–4.4)
Cholesterol, Total: 207 mg/dL — ABNORMAL HIGH (ref 100–199)
HDL: 57 mg/dL (ref >39)
LDL Calculated: 128 mg/dL — ABNORMAL HIGH (ref 0–99)
Triglycerides: 110 mg/dL (ref 0–149)
VLDL Cholesterol Cal: 22 mg/dL (ref 5–40)

## 2018-04-12 LAB — THYROID PANEL WITH TSH
Free Thyroxine Index: 2.1 (ref 1.2–4.9)
T3 Uptake Ratio: 30 % (ref 24–39)
T4, Total: 7 ug/dL (ref 4.5–12.0)
TSH: 3.28 u[IU]/mL (ref 0.450–4.500)

## 2018-05-10 ENCOUNTER — Telehealth: Payer: Self-pay | Admitting: *Deleted

## 2018-05-10 NOTE — Telephone Encounter (Signed)
I spoke with pharmacy and they will give her the 2 separate losartan 100 and hydrochlorothiazide 25 mg and they will have them ready for her now. 

## 2018-05-10 NOTE — Telephone Encounter (Signed)
Pt called stating [facility] says she needed RF on her losartan-hctz 100-25 mg TC to pharmacy, this is a strength that is on back order Please advise if send separate prescriptions for these medications or send in an alternative

## 2018-05-12 ENCOUNTER — Telehealth: Payer: Self-pay | Admitting: *Deleted

## 2018-05-12 MED ORDER — HYDROCHLOROTHIAZIDE 25 MG PO TABS: 25.0000 mg | ORAL_TABLET | Freq: Every day | ORAL | 3 refills | Status: DC

## 2018-05-12 MED ORDER — LOSARTAN POTASSIUM 100 MG PO TABS: 100.0000 mg | ORAL_TABLET | Freq: Every day | ORAL | 3 refills | Status: DC

## 2018-05-12 NOTE — Telephone Encounter (Signed)
Fax from [facility] on backorder Please send in new Rx for separate Rxs Losartan 100 mg and HCTZ 25 mg Please advise

## 2018-09-07 ENCOUNTER — Telehealth: Payer: Self-pay | Admitting: Nurse Practitioner

## 2018-11-06 ENCOUNTER — Other Ambulatory Visit: Payer: Self-pay | Admitting: Nurse Practitioner

## 2018-11-06 DIAGNOSIS — E034 Atrophy of thyroid (acquired): Secondary | ICD-10-CM

## 2018-11-13 ENCOUNTER — Other Ambulatory Visit: Payer: Self-pay | Admitting: Nurse Practitioner

## 2018-11-13 DIAGNOSIS — I10 Essential (primary) hypertension: Secondary | ICD-10-CM

## 2018-11-13 NOTE — Telephone Encounter (Signed)
Last seen 04/11/18

## 2018-12-14 ENCOUNTER — Other Ambulatory Visit: Payer: Self-pay

## 2018-12-14 ENCOUNTER — Ambulatory Visit (INDEPENDENT_AMBULATORY_CARE_PROVIDER_SITE_OTHER): Payer: Medicare Other | Admitting: *Deleted

## 2018-12-14 DIAGNOSIS — Z0000 Encounter for general adult medical examination without abnormal findings: Secondary | ICD-10-CM

## 2018-12-14 NOTE — Progress Notes (Signed)
MEDICARE ANNUAL WELLNESS VISIT  12/14/2018  Telephone Visit Disclaimer This Medicare AWV was conducted by telephone due to national recommendations for restrictions regarding the COVID-19 Pandemic (e.g. social distancing).  I verified, using two identifiers, that I am speaking with Nancy Holt or their authorized healthcare agent. I discussed the limitations, risks, security, and privacy concerns of performing an evaluation and management service by telephone and the potential availability of an in-person appointment in the future. The patient expressed understanding and agreed to proceed.   Subjective:  Nancy Holt is a 85 y.o. female patient of Bennie Pierini, FNP who had a Medicare Annual Wellness Visit today via telephone. Nancy Holt is Retired and lives alone. she has 5 children. she reports that she is socially active and does interact with friends/family regularly. she is minimally physically active and enjoys playing games with her family, cards and doing puzzles.  Patient Care Team: Bennie Pierini, FNP as PCP - General (Nurse Practitioner)  Advanced Directives 12/14/2018 04/25/2015  Does Patient Have a Medical Advance Directive? Yes Yes  Type of Estate agent of [employer] Power of [place];Living will  Does patient want to make changes to medical advance directive? No - Patient declined -  Copy of Healthcare Power of Attorney in Chart? No - copy requested [facility] Utilization Over the Past 12 Months: # of hospitalizations or ER visits: 0 # of surgeries: 0  Review of Systems    Patient reports that her overall health is worse due to her arthritis compared to last year.  Patient Reported Readings (BP, Pulse, CBG, Weight, etc) none  Review of Systems: No complaints  All other systems negative.  Pain Assessment Pain : No/denies pain     Current Medications & Allergies (verified) Allergies as of 12/14/2018    Reactions   Lipitor [atorvastatin Calcium]    myalgias   Lisinopril Cough      Medication List       Accurate as of Dec 14, 2018  2:57 PM. If you have any questions, ask your nurse or doctor.        diltiazem 240 MG 24 hr capsule Commonly known as:  CARDIZEM CD TAKE 1 CAPSULE BY MOUTH EVERY DAY   Flax Seed Oil 1000 MG Caps Take by mouth. 3 daily   hydrochlorothiazide 25 MG tablet Commonly known as:  HYDRODIURIL Take 1 tablet (25 mg total) by mouth daily.   levothyroxine 25 MCG tablet Commonly known as:  SYNTHROID TAKE 1 TABLET BY MOUTH EVERY MORNING   losartan 100 MG tablet Commonly known as:  COZAAR Take 1 tablet (100 mg total) by mouth daily.   Vitamin D 50 MCG (2000 UT) Caps Take by mouth. One daily       History (reviewed): Past Medical History:  Diagnosis Date  . Allergy   . Arthritis   . COPD (chronic obstructive pulmonary disease) (HCC)   . Diverticulosis   . GERD (gastroesophageal reflux disease)   . Hyperlipidemia   . Hypertension   . Thyroid disease    History reviewed. No pertinent surgical history. Family History  Problem Relation Age of Onset  . Aneurysm Father    Social History   Socioeconomic History  . Marital status: Divorced    Spouse name: Not on file  . Number of children: 5  . Years of education: Not on file  . Highest education level: High school graduate  Occupational History  . Occupation: Retired  Chief Executive Officer  Needs  . Financial resource strain: Not hard at all  . Food insecurity:    Worry: Never true    Inability: Never true  . Transportation needs:    Medical: No    Non-medical: No  Tobacco Use  . Smoking status: Former Smoker    Last attempt to quit: 12/13/1968    Years since quitting: 50.0  . Smokeless tobacco: Never Used  Substance and Sexual Activity  . Alcohol use: No  . Drug use: Never  . Sexual activity: Not Currently    Birth control/protection: Post-menopausal  Lifestyle  . Physical activity:    Days per  week: 3 days    Minutes per session: 30 min  . Stress: Not at all  Relationships  . Social connections:    Talks on phone: More than three times a week    Gets together: More than three times a week    Attends religious service: Never    Active member of club or organization: No    Attends meetings of clubs or organizations: Never    Relationship status: Divorced  Other Topics Concern  . Not on file  Social History Narrative  . Not on file    Activities of Daily Living In your present state of health, do you have any difficulty performing the following activities: 12/14/2018  Hearing? Y  Comment pt states she is hard of hearing at times  Vision? N  Difficulty concentrating or making decisions? N  Walking or climbing stairs? Y  Comment pt doesn't like walking up stairs due to having to use a cane  Dressing or bathing? N  Doing errands, shopping? N  Preparing Food and eating ? N  Using the Toilet? N  In the past six months, have you accidently leaked urine? Y  Comment pt says she has to go to the bathroom often  Do you have problems with loss of bowel control? N  Managing your Medications? N  Managing your Finances? N  Housekeeping or managing your Housekeeping? Y  Comment her children take care of all the housekeeping  Some recent data might be hidden    Patient Literacy How often do you need to have someone help you when you read instructions, pamphlets, or other written materials from your doctor or pharmacy?: 1 - Never What is the last grade level you completed in school?: 12th grade  Exercise Current Exercise Habits: Home exercise routine, Type of exercise: Other - see comments(stationary bike), Time (Minutes): 25, Frequency (Times/Week): 3, Weekly Exercise (Minutes/Week): 75, Intensity: Mild, Exercise limited by: None identified  Diet Patient reports consuming 2 meals a day and 2 snack(s) a day Patient reports that her primary diet is: Regular Patient reports that  she does have regular access to food.   Depression Screen PHQ 2/9 Scores 12/14/2018 04/11/2018 06/24/2017 04/12/2016 11/24/2015 08/19/2015 04/25/2015  PHQ - 2 Score 0 0 0 0 0 0 0     Fall Risk Fall Risk  12/14/2018 04/11/2018 06/24/2017 04/12/2016 11/24/2015  Falls in the past year? 0 No No No No  Number falls in past yr: 0 - - - -  Injury with Fall? 0 - - - -     Objective:  Nancy PouMarilyn L Holt seemed alert and oriented and she participated appropriately during our telephone visit.  Blood Pressure Weight BMI  BP Readings from Last 3 Encounters:  04/11/18 (!) 153/72  06/24/17 (!) 144/71  04/12/16 (!) 167/73   Wt Readings from Last 3 Encounters:  04/11/18 162 lb 12.8 oz (73.8 kg)  06/24/17 168 lb (76.2 kg)  04/12/16 171 lb (77.6 kg)   BMI Readings from Last 1 Encounters:  04/11/18 27.94 kg/m    *Unable to obtain current vital signs, weight, and BMI due to telephone visit type  Hearing/Vision  . Nancy Holt did not seem to have difficulty with hearing/understanding during the telephone conversation . Reports that she has not had a formal eye exam by an eye care professional within the past year . Reports that she has not had a formal hearing evaluation within the past year *Unable to fully assess hearing and vision during telephone visit type  Cognitive Function: 6CIT Screen 12/14/2018  What Year? 0 points  What month? 0 points  What time? 0 points  Count back from 20 0 points  Months in reverse 0 points  Repeat phrase 2 points  Total Score 2    Normal Cognitive Function Screening: Yes (Normal:0-7, Significant for Dysfunction: >8)  Immunization & Health Maintenance Record Immunization History  Administered Date(s) Administered  . DTaP 11/21/2009  . Influenza Split 04/11/2013  . Influenza Whole 04/14/2010  . Influenza,inj,Quad PF,6+ Mos 04/25/2014  . Pneumococcal Conjugate-13 01/21/2015  . Pneumococcal Polysaccharide-23 05/22/1995  . Tdap 01/05/2011    Health Maintenance   Topic Date Due  . MAMMOGRAM  10/13/1940  . INFLUENZA VACCINE  02/17/2019  . TETANUS/TDAP  01/04/2021  . DEXA SCAN  Completed  . PNA vac Low Risk Adult  Completed       Assessment  This is a routine wellness examination for Nancy Holt.  Health Maintenance: Due or Overdue Health Maintenance Due  Topic Date Due  . MAMMOGRAM  10/13/1940    Nancy Holt does not need a referral for Community Assistance: Care Management:   no Social Work:    no Prescription Assistance:  no Nutrition/Diabetes Education:  no   Plan:  Personalized Goals Goals Addressed            This Visit's Progress   . Patient Stated (pt-stated)       Pt states she "would like to walk without a cane"      Personalized Health Maintenance & Screening Recommendations  Shingles Vaccine  Lung Cancer Screening Recommended: no (Low Dose CT Chest recommended if Age 55-80 years, 30 pack-year currently smoking OR have quit w/in past 15 years) Hepatitis C Screening recommended: no HIV Screening recommended: no  Advanced Directives: Written information was not prepared per patient's request.  Referrals & Orders No orders of the defined types were placed in this encounter.   Follow-up Plan . Follow-up with Bennie Pierini, FNP as planned . Consider Shingles Vaccine    I have personally reviewed and noted the following in the patient's chart:   . Medical and social history . Use of alcohol, tobacco or illicit drugs  . Current medications and supplements . Functional ability and status . Nutritional status . Physical activity . Advanced directives . List of other physicians . Hospitalizations, surgeries, and ER visits in previous 12 months . Vitals . Screenings to include cognitive, depression, and falls . Referrals and appointments  In addition, I have reviewed and discussed with Nancy Holt certain preventive protocols, quality metrics, and best practice recommendations. A  written personalized care plan for preventive services as well as general preventive health recommendations is available and can be mailed to the patient at her request.      Margurite Auerbach  12/14/2018

## 2018-12-14 NOTE — Patient Instructions (Signed)
Preventive Care 83 Years and Older, Female Preventive care refers to lifestyle choices and visits with your health care provider that can promote health and wellness. What does preventive care include?  A yearly physical exam. This is also called an annual well check.  Dental exams once or twice a year.  Routine eye exams. Ask your health care provider how often you should have your eyes checked.  Personal lifestyle choices, including: ? Daily care of your teeth and gums. ? Regular physical activity. ? Eating a healthy diet. ? Avoiding tobacco and drug use. ? Limiting alcohol use. ? Practicing safe sex. ? Taking low-dose aspirin every day. ? Taking vitamin and mineral supplements as recommended by your health care provider. What happens during an annual well check? The services and screenings done by your health care provider during your annual well check will depend on your age, overall health, lifestyle risk factors, and family history of disease. Counseling Your health care provider may ask you questions about your:  Alcohol use.  Tobacco use.  Drug use.  Emotional well-being.  Home and relationship well-being.  Sexual activity.  Eating habits.  History of falls.  Memory and ability to understand (cognition).  Work and work Statistician.  Reproductive health.  Screening You may have the following tests or measurements:  Height, weight, and BMI.  Blood pressure.  Lipid and cholesterol levels. These may be checked every 5 years, or more frequently if you are over 30 years old.  Skin check.  Lung cancer screening. You may have this screening every year starting at age 27 if you have a 30-pack-year history of smoking and currently smoke or have quit within the past 15 years.  Colorectal cancer screening. All adults should have this screening starting at age 33 and continuing until age 46. You will have tests every 1-10 years, depending on your results and the  type of screening test. People at increased risk should start screening at an earlier age. Screening tests may include: ? Guaiac-based fecal occult blood testing. ? Fecal immunochemical test (FIT). ? Stool DNA test. ? Virtual colonoscopy. ? Sigmoidoscopy. During this test, a flexible tube with a tiny camera (sigmoidoscope) is used to examine your rectum and lower colon. The sigmoidoscope is inserted through your anus into your rectum and lower colon. ? Colonoscopy. During this test, a long, thin, flexible tube with a tiny camera (colonoscope) is used to examine your entire colon and rectum.  Hepatitis C blood test.  Hepatitis B blood test.  Sexually transmitted disease (STD) testing.  Diabetes screening. This is done by checking your blood sugar (glucose) after you have not eaten for a while (fasting). You may have this done every 1-3 years.  Bone density scan. This is done to screen for osteoporosis. You may have this done starting at age 37.  Mammogram. This may be done every 1-2 years. Talk to your health care provider about how often you should have regular mammograms. Talk with your health care provider about your test results, treatment options, and if necessary, the need for more tests. Vaccines Your health care provider may recommend certain vaccines, such as:  Influenza vaccine. This is recommended every year.  Tetanus, diphtheria, and acellular pertussis (Tdap, Td) vaccine. You may need a Td booster every 10 years.  Varicella vaccine. You may need this if you have not been vaccinated.  Zoster vaccine. You may need this after age 38.  Measles, mumps, and rubella (MMR) vaccine. You may need at least  one dose of MMR if you were born in 1957 or later. You may also need a second dose.  Pneumococcal 13-valent conjugate (PCV13) vaccine. One dose is recommended after age 24.  Pneumococcal polysaccharide (PPSV23) vaccine. One dose is recommended after age 24.  Meningococcal  vaccine. You may need this if you have certain conditions.  Hepatitis A vaccine. You may need this if you have certain conditions or if you travel or work in places where you may be exposed to hepatitis A.  Hepatitis B vaccine. You may need this if you have certain conditions or if you travel or work in places where you may be exposed to hepatitis B.  Haemophilus influenzae type b (Hib) vaccine. You may need this if you have certain conditions. Talk to your health care provider about which screenings and vaccines you need and how often you need them. This information is not intended to replace advice given to you by your health care provider. Make sure you discuss any questions you have with your health care provider. Document Released: 08/01/2015 Document Revised: 08/25/2017 Document Reviewed: 05/06/2015 Elsevier Interactive Patient Education  2019 [employer].

## 2019-02-06 ENCOUNTER — Other Ambulatory Visit: Payer: Self-pay | Admitting: Nurse Practitioner

## 2019-02-06 DIAGNOSIS — I10 Essential (primary) hypertension: Secondary | ICD-10-CM

## 2019-03-02 ENCOUNTER — Other Ambulatory Visit: Payer: Self-pay | Admitting: Nurse Practitioner

## 2019-03-02 DIAGNOSIS — I10 Essential (primary) hypertension: Secondary | ICD-10-CM

## 2019-03-05 NOTE — Telephone Encounter (Signed)
Patient aware.  Appt made  

## 2019-03-05 NOTE — Telephone Encounter (Signed)
MMM. NTBS 30 days gived 02/06/19

## 2019-03-27 ENCOUNTER — Other Ambulatory Visit: Payer: Self-pay

## 2019-03-27 ENCOUNTER — Ambulatory Visit (INDEPENDENT_AMBULATORY_CARE_PROVIDER_SITE_OTHER): Payer: Medicare Other | Admitting: Nurse Practitioner

## 2019-03-27 ENCOUNTER — Encounter: Payer: Self-pay | Admitting: Nurse Practitioner

## 2019-03-27 VITALS — BP 134/77 | HR 86 | Temp 97.4°F | Ht 65.0 in | Wt 151.0 lb

## 2019-03-27 DIAGNOSIS — I872 Venous insufficiency (chronic) (peripheral): Secondary | ICD-10-CM | POA: Diagnosis not present

## 2019-03-27 DIAGNOSIS — J410 Simple chronic bronchitis: Secondary | ICD-10-CM

## 2019-03-27 DIAGNOSIS — E034 Atrophy of thyroid (acquired): Secondary | ICD-10-CM

## 2019-03-27 DIAGNOSIS — K5790 Diverticulosis of intestine, part unspecified, without perforation or abscess without bleeding: Secondary | ICD-10-CM | POA: Diagnosis not present

## 2019-03-27 DIAGNOSIS — I10 Essential (primary) hypertension: Secondary | ICD-10-CM | POA: Diagnosis not present

## 2019-03-27 DIAGNOSIS — Z9989 Dependence on other enabling machines and devices: Secondary | ICD-10-CM

## 2019-03-27 DIAGNOSIS — E782 Mixed hyperlipidemia: Secondary | ICD-10-CM

## 2019-03-27 DIAGNOSIS — G4733 Obstructive sleep apnea (adult) (pediatric): Secondary | ICD-10-CM | POA: Diagnosis not present

## 2019-03-27 DIAGNOSIS — K219 Gastro-esophageal reflux disease without esophagitis: Secondary | ICD-10-CM

## 2019-03-27 DIAGNOSIS — R2681 Unsteadiness on feet: Secondary | ICD-10-CM

## 2019-03-27 DIAGNOSIS — Z6829 Body mass index (BMI) 29.0-29.9, adult: Secondary | ICD-10-CM

## 2019-03-27 MED ORDER — HYDROCHLOROTHIAZIDE 25 MG PO TABS: 25.0000 mg | ORAL_TABLET | Freq: Every day | ORAL | 1 refills | Status: DC

## 2019-03-27 MED ORDER — DILTIAZEM HCL ER COATED BEADS 240 MG PO CP24: 240.0000 mg | ORAL_CAPSULE | Freq: Every day | ORAL | 1 refills | Status: DC

## 2019-03-27 MED ORDER — LEVOTHYROXINE SODIUM 25 MCG PO TABS: 25.0000 ug | ORAL_TABLET | Freq: Every morning | ORAL | 1 refills | Status: DC

## 2019-03-27 MED ORDER — LOSARTAN POTASSIUM 100 MG PO TABS: 100.0000 mg | ORAL_TABLET | Freq: Every day | ORAL | 1 refills | Status: DC

## 2019-03-27 NOTE — Progress Notes (Addendum)
Subjective:    Patient ID: Nancy Holt, female    DOB: 1922-10-20, 85 y.o.   MRN: [id number]   Chief Complaint: medical manegment of chronic issues   HPI:  1. Essential hypertension No c/o chest pain, sob or headache. Does not check blood pressure at home. BP Readings from Last 3 Encounters:  04/11/18 (!) 153/72  06/24/17 (!) 144/71  04/12/16 (!) 167/73     2. Venous (peripheral) insufficiency Has daily edema that usually resolves during the night  3. Simple chronic bronchitis ([place]) Says that she has been doing well. No recent cough  4. OSA on CPAP Does not wear cpap anymore at night  5. Diverticulosis No recent flare up. No c/o diarrhea or constipation  6. Gastroesophageal reflux disease without esophagitis Usually doe snot bother her and is on no rx meds for this.  7. Hypothyroidism due to acquired atrophy of thyroid No problems that she is aware of.  8. Mixed hyperlipidemia Tries to avoid fried foods. Does very little exercise if any. She tries to stay as active as she can  9. BMI 29.0-29.9,adult No recent weight changes    Outpatient Encounter Medications as of 03/27/2019  Medication Sig  . Cholecalciferol (VITAMIN D) 2000 UNITS CAPS Take by mouth. One daily    . diltiazem (CARDIZEM CD) 240 MG 24 hr capsule Take 1 capsule (240 mg total) by mouth daily. (Needs to be seen before next refill)  . Flaxseed, Linseed, (FLAX SEED OIL) 1000 MG CAPS Take by mouth. 3 daily     . hydrochlorothiazide (HYDRODIURIL) 25 MG tablet Take 1 tablet (25 mg total) by mouth daily.  Marland Kitchen levothyroxine (SYNTHROID) 25 MCG tablet TAKE 1 TABLET BY MOUTH EVERY MORNING  . losartan (COZAAR) 100 MG tablet Take 1 tablet (100 mg total) by mouth daily.     Family History  Problem Relation Age of Onset  . Aneurysm Father     New complaints: Gait is becoming unsteady and she would like to have a walker to use   Social history: Lives by herself and daughters check on her daily   Controlled substance contract: n/a    Review of Systems  Constitutional: Negative for activity change and appetite change.  HENT: Negative.   Eyes: Negative for pain.  Respiratory: Negative for shortness of breath.   Cardiovascular: Negative for chest pain, palpitations and leg swelling.  Gastrointestinal: Negative for abdominal pain.  Endocrine: Negative for polydipsia.  Genitourinary: Negative.   Skin: Negative for rash.  Neurological: Negative for dizziness, weakness and headaches.  Hematological: Does not bruise/bleed easily.  Psychiatric/Behavioral: Negative.   All other systems reviewed and are negative.      Objective:   Physical Exam Vitals signs and nursing note reviewed.  Constitutional:      General: She is not in acute distress.    Appearance: Normal appearance. She is well-developed.  HENT:     Head: Normocephalic.     Nose: Nose normal.  Eyes:     Pupils: Pupils are equal, round, and reactive to light.  Neck:     Musculoskeletal: Normal range of motion and neck supple.     Vascular: No carotid bruit or JVD.  Cardiovascular:     Rate and Rhythm: Normal rate and regular rhythm.     Heart sounds: Normal heart sounds.  Pulmonary:     Effort: Pulmonary effort is normal. No respiratory distress.     Breath sounds: Normal breath sounds. No wheezing or rales.  Chest:  Chest wall: No tenderness.  Abdominal:     General: Bowel sounds are normal. There is no distension or abdominal bruit.     Palpations: Abdomen is soft. There is no hepatomegaly, splenomegaly, mass or pulsatile mass.     Tenderness: There is no abdominal tenderness.  Musculoskeletal: Normal range of motion.  Lymphadenopathy:     Cervical: No cervical adenopathy.  Skin:    General: Skin is warm and dry.     Comments: Hard flesh colored lesion on right 5th toe  Neurological:     Mental Status: She is alert and oriented to person, place, and time.     Deep Tendon Reflexes: Reflexes are  normal and symmetric.  Psychiatric:        Behavior: Behavior normal.        Thought Content: Thought content normal.        Judgment: Judgment normal.    BP 134/77   Pulse 86   Temp (!) 97.4 F (36.3 C) (Oral)   Ht 5' 5" (1.651 m)   Wt 151 lb (68.5 kg)   BMI 25.13 kg/m        Assessment & Plan:  Nancy Holt comes in today with chief complaint of Medication Refill   Diagnosis and orders addressed:  1. Essential hypertension Low sodium diet - losartan (COZAAR) 100 MG tablet; Take 1 tablet (100 mg total) by mouth daily.  Dispense: 90 tablet; Refill: 1 - hydrochlorothiazide (HYDRODIURIL) 25 MG tablet; Take 1 tablet (25 mg total) by mouth daily.  Dispense: 90 tablet; Refill: 1 - diltiazem (CARDIZEM CD) 240 MG 24 hr capsule; Take 1 capsule (240 mg total) by mouth daily. (Needs to be seen before next refill)  Dispense: 90 capsule; Refill: 1 - CMP14+EGFR  2. Venous (peripheral) insufficiency Elevate legs if swelling  3. Simple chronic bronchitis ([place])  4. OSA on CPAP  5. Diverticulosis Watch diet to prevent flare up  6. Gastroesophageal reflux disease without esophagitis Avoid spicy foods Do not eat 2 hours prior to bedtime  7. Hypothyroidism due to acquired atrophy of thyroid - levothyroxine (SYNTHROID) 25 MCG tablet; Take 1 tablet (25 mcg total) by mouth every morning.  Dispense: 90 tablet; Refill: 1  8. Mixed hyperlipidemia Low fat diet  9. BMI 29.0-29.9,adult Discussed diet and exercise for person with BMI >25 Will recheck weight in 3-6 months  10. Unsteady gait Walker with gait  Labs pending Health Maintenance reviewed Diet and exercise encouraged  Follow up plan: 6 months   [place], FNP

## 2019-03-27 NOTE — Addendum Note (Signed)
Addended by: Chevis Pretty on: 03/27/2019 04:54 PM   Modules accepted: Orders

## 2019-03-27 NOTE — Patient Instructions (Signed)

## 2019-03-28 LAB — CMP14+EGFR
ALT: 11 IU/L (ref 0–32)
AST: 15 IU/L (ref 0–40)
Albumin/Globulin Ratio: 1.7 (ref 1.2–2.2)
Albumin: 4.4 g/dL (ref 3.5–4.6)
Alkaline Phosphatase: 22 IU/L — ABNORMAL LOW (ref 39–117)
BUN/Creatinine Ratio: 17 (ref 12–28)
BUN: 20 mg/dL (ref 10–36)
Bilirubin Total: 0.4 mg/dL (ref 0.0–1.2)
CO2: 21 mmol/L (ref 20–29)
Calcium: 9.9 mg/dL (ref 8.7–10.3)
Chloride: 100 mmol/L (ref 96–106)
Creatinine, Ser: 1.18 mg/dL — ABNORMAL HIGH (ref 0.57–1.00)
GFR calc Af Amer: 46 mL/min/{1.73_m2} — ABNORMAL LOW (ref >59)
GFR calc non Af Amer: 40 mL/min/{1.73_m2} — ABNORMAL LOW (ref >59)
Globulin, Total: 2.6 g/dL (ref 1.5–4.5)
Glucose: 90 mg/dL (ref 65–99)
Potassium: 4.8 mmol/L (ref 3.5–5.2)
Sodium: 139 mmol/L (ref 134–144)
Total Protein: 7 g/dL (ref 6.0–8.5)

## 2019-05-23 DIAGNOSIS — L820 Inflamed seborrheic keratosis: Secondary | ICD-10-CM | POA: Diagnosis not present

## 2019-09-18 ENCOUNTER — Other Ambulatory Visit: Payer: Self-pay | Admitting: Nurse Practitioner

## 2019-09-18 DIAGNOSIS — I10 Essential (primary) hypertension: Secondary | ICD-10-CM

## 2019-09-25 ENCOUNTER — Ambulatory Visit: Payer: Self-pay | Admitting: Nurse Practitioner

## 2019-12-12 DIAGNOSIS — H401113 Primary open-angle glaucoma, right eye, severe stage: Secondary | ICD-10-CM | POA: Diagnosis not present

## 2019-12-12 DIAGNOSIS — H401121 Primary open-angle glaucoma, left eye, mild stage: Secondary | ICD-10-CM | POA: Diagnosis not present

## 2019-12-12 DIAGNOSIS — H40831 Aqueous misdirection, right eye: Secondary | ICD-10-CM | POA: Diagnosis not present

## 2019-12-12 DIAGNOSIS — Z961 Presence of intraocular lens: Secondary | ICD-10-CM | POA: Diagnosis not present

## 2019-12-12 DIAGNOSIS — H25812 Combined forms of age-related cataract, left eye: Secondary | ICD-10-CM | POA: Diagnosis not present

## 2019-12-14 DIAGNOSIS — H40831 Aqueous misdirection, right eye: Secondary | ICD-10-CM | POA: Diagnosis not present

## 2019-12-14 DIAGNOSIS — H25812 Combined forms of age-related cataract, left eye: Secondary | ICD-10-CM | POA: Diagnosis not present

## 2019-12-14 DIAGNOSIS — Z961 Presence of intraocular lens: Secondary | ICD-10-CM | POA: Diagnosis not present

## 2020-01-04 DIAGNOSIS — H02423 Myogenic ptosis of bilateral eyelids: Secondary | ICD-10-CM | POA: Diagnosis not present

## 2020-01-04 DIAGNOSIS — H0102B Squamous blepharitis left eye, upper and lower eyelids: Secondary | ICD-10-CM | POA: Diagnosis not present

## 2020-01-04 DIAGNOSIS — H25812 Combined forms of age-related cataract, left eye: Secondary | ICD-10-CM | POA: Diagnosis not present

## 2020-01-04 DIAGNOSIS — H0102A Squamous blepharitis right eye, upper and lower eyelids: Secondary | ICD-10-CM | POA: Diagnosis not present

## 2020-01-04 DIAGNOSIS — H40831 Aqueous misdirection, right eye: Secondary | ICD-10-CM | POA: Diagnosis not present

## 2020-01-15 ENCOUNTER — Ambulatory Visit (INDEPENDENT_AMBULATORY_CARE_PROVIDER_SITE_OTHER): Payer: Medicare Other

## 2020-01-15 DIAGNOSIS — Z0000 Encounter for general adult medical examination without abnormal findings: Secondary | ICD-10-CM

## 2020-01-15 NOTE — Patient Instructions (Signed)
  MEDICARE ANNUAL WELLNESS VISIT Health Maintenance Summary and Written Plan of Care  Ms. Wisor ,  Thank you for allowing me to perform your Medicare Annual Wellness Visit and for your ongoing commitment to your health.   Health Maintenance & Immunization History Health Maintenance  Topic Date Due  . COVID-19 Vaccine (1) Never done  . MAMMOGRAM  Never done  . INFLUENZA VACCINE  02/17/2020  . TETANUS/TDAP  01/04/2021  . DEXA SCAN  Completed  . PNA vac Low Risk Adult  Completed   Immunization History  Administered Date(s) Administered  . DTaP 11/21/2009  . Influenza Split 04/11/2013  . Influenza Whole 04/14/2010  . Influenza,inj,Quad PF,6+ Mos 04/25/2014  . Pneumococcal Conjugate-13 01/21/2015  . Pneumococcal Polysaccharide-23 05/22/1995  . Tdap 01/05/2011    These are the patient goals that we discussed: Goals Addressed            This Visit's Progress   . Prevent falls          This is a list of Health Maintenance Items that are overdue or due now: Health Maintenance Due  Topic Date Due  . COVID-19 Vaccine (1) Never done  . MAMMOGRAM  Never done     Orders/Referrals Placed Today: No orders of the defined types were placed in this encounter.  (Contact our referral department at [phone] if you have not spoken with someone about your referral appointment within the next 5 days)    Follow-up Plan  Scheduled with Mary-Margaret 03/25/2020 at 3:15pm.

## 2020-01-15 NOTE — Progress Notes (Signed)
MEDICARE ANNUAL WELLNESS VISIT  01/15/2020  Telephone Visit Disclaimer This Medicare AWV was conducted by telephone due to national recommendations for restrictions regarding the COVID-19 Pandemic (e.g. social distancing).  I verified, using two identifiers, that I am speaking with Nancy Holt or their authorized healthcare agent. I discussed the limitations, risks, security, and privacy concerns of performing an evaluation and management service by telephone and the potential availability of an in-person appointment in the future. The patient expressed understanding and agreed to proceed.   Subjective:  Nancy Holt is a 86 y.o. female patient of Bennie Pierini, FNP who had a Medicare Annual Wellness Visit today via telephone. Nancy Holt is Retired and lives alone. she has five children. she reports that she is socially active and does interact with friends/family regularly. she is minimally physically active and enjoys crossword puzzels.  Patient Care Team: Bennie Pierini, FNP as PCP - General (Nurse Practitioner)  Advanced Directives 01/15/2020 12/14/2018 04/25/2015  Does Patient Have a Medical Advance Directive? Yes Yes Yes  Type of Advance Directive Living will Healthcare Power of [employer] of [place];Living will  Does patient want to make changes to medical advance directive? No - Patient declined No - Patient declined -  Copy of Healthcare Power of Attorney in Chart? - No - copy requested [facility] Utilization Over the Past 12 Months: # of hospitalizations or ER visits: 0 # of surgeries: 0  Review of Systems    Patient reports that her overall health is unchanged compared to last year.    Patient Reported Readings (BP, Pulse, CBG, Weight, etc) none  Pain Assessment Pain : 0-10 Pain Score: 5  Pain Type: Chronic pain Pain Location: Back Pain Descriptors / Indicators: Discomfort Pain Onset: More than a month ago Pain Frequency:  Intermittent Pain Relieving Factors: OTC inflammatory  Pain Relieving Factors: OTC inflammatory  Current Medications & Allergies (verified) Allergies as of 01/15/2020      Reactions   Lipitor [atorvastatin Calcium]    myalgias   Lisinopril Cough      Medication List       Accurate as of January 15, 2020  2:03 PM. If you have any questions, ask your nurse or doctor.        diltiazem 240 MG 24 hr capsule Commonly known as: CARDIZEM CD TAKE 1 CAPSULE (240 MG TOTAL) BY MOUTH DAILY. (NEEDS TO BE SEEN BEFORE NEXT REFILL)   Flax Seed Oil 1000 MG Caps Take by mouth. 3 daily   hydrochlorothiazide 25 MG tablet Commonly known as: HYDRODIURIL TAKE 1 TABLET BY MOUTH EVERY DAY   levothyroxine 25 MCG tablet Commonly known as: SYNTHROID Take 1 tablet (25 mcg total) by mouth every morning.   losartan 100 MG tablet Commonly known as: COZAAR Take 1 tablet (100 mg total) by mouth daily.   Vitamin D 50 MCG (2000 UT) Caps Take by mouth. One daily       History (reviewed): Past Medical History:  Diagnosis Date  . Allergy   . Arthritis   . COPD (chronic obstructive pulmonary disease) (HCC)   . Diverticulosis   . GERD (gastroesophageal reflux disease)   . Hyperlipidemia   . Hypertension   . Thyroid disease    No past surgical history on file. Family History  Problem Relation Age of Onset  . Aneurysm Father    Social History   Socioeconomic History  . Marital status: Divorced    Spouse name: Not on file  .  Number of children: 5  . Years of education: Not on file  . Highest education level: High school graduate  Occupational History  . Occupation: Retired  Tobacco Use  . Smoking status: Former Smoker    Quit date: 12/13/1968    Years since quitting: 51.1  . Smokeless tobacco: Never Used  Vaping Use  . Vaping Use: Never used  Substance and Sexual Activity  . Alcohol use: No  . Drug use: Never  . Sexual activity: Not Currently    Birth control/protection:  Post-menopausal  Other Topics Concern  . Not on file  Social History Narrative  . Not on file   Social Determinants of Health   Financial Resource Strain:   . Difficulty of Paying Living Expenses:   Food Insecurity:   . Worried About Programme researcher, broadcasting/film/videounning Out of Food in the Last Year:   . Baristaan Out of Food in the Last Year:   Transportation Needs:   . Freight forwarderLack of Transportation (Medical):   Marland Kitchen. Lack of Transportation (Non-Medical):   Physical Activity:   . Days of Exercise per Week:   . Minutes of Exercise per Session:   Stress:   . Feeling of Stress :   Social Connections:   . Frequency of Communication with Friends and Family:   . Frequency of Social Gatherings with Friends and Family:   . Attends Religious Services:   . Active Member of Clubs or Organizations:   . Attends BankerClub or Organization Meetings:   Marland Kitchen. Marital Status:     Activities of Daily Living In your present state of health, do you have any difficulty performing the following activities: 01/15/2020  Hearing? Y  Vision? N  Difficulty concentrating or making decisions? N  Walking or climbing stairs? Y  Dressing or bathing? N  Doing errands, shopping? N  Preparing Food and eating ? N  Using the Toilet? N  In the past six months, have you accidently leaked urine? N  Do you have problems with loss of bowel control? N  Managing your Medications? N  Managing your Finances? N  Housekeeping or managing your Housekeeping? N  Some recent data might be hidden    Patient Education/ Literacy How often do you need to have someone help you when you read instructions, pamphlets, or other written materials from your doctor or pharmacy?: 2 - Rarely What is the last grade level you completed in school?: [employer] after HS  Exercise Current Exercise Habits: Home exercise routine, Type of exercise: strength training/weights;walking, Time (Minutes): 20, Frequency (Times/Week): 5, Weekly Exercise (Minutes/Week): 100, Intensity:  Mild  Diet Patient reports consuming 3 meals a day and 2 snack(s) a day Patient reports that her primary diet is: Regular Patient reports that she does have regular access to food.   Depression Screen PHQ 2/9 Scores 01/15/2020 03/27/2019 12/14/2018 04/11/2018 06/24/2017 04/12/2016 11/24/2015  PHQ - 2 Score 0 0 0 0 0 0 0  PHQ- 9 Score - 0 - - - - -     Fall Risk Fall Risk  01/15/2020 03/27/2019 12/14/2018 04/11/2018 06/24/2017  Falls in the past year? 0 1 0 No No  Number falls in past yr: - 0 0 - -  Injury with Fall? - 0 0 - -     Objective:  Nancy Holt seemed alert and oriented and she participated appropriately during our telephone visit.  Blood Pressure Weight BMI  BP Readings from Last 3 Encounters:  03/27/19 134/77  04/11/18 (!) 153/72  06/24/17 Marland Kitchen(!)  144/71   Wt Readings from Last 3 Encounters:  03/27/19 151 lb (68.5 kg)  04/11/18 162 lb 12.8 oz (73.8 kg)  06/24/17 168 lb (76.2 kg)   BMI Readings from Last 1 Encounters:  03/27/19 25.13 kg/m    *Unable to obtain current vital signs, weight, and BMI due to telephone visit type  Hearing/Vision  . Nancy Holt did  seem to have difficulty with hearing/understanding during the telephone conversation . Reports that she has not had a formal eye exam by an eye care professional within the past year . Reports that she has not had a formal hearing evaluation within the past year *Unable to fully assess hearing and vision during telephone visit type  Cognitive Function: 6CIT Screen 01/15/2020 12/14/2018  What Year? 0 points 0 points  What month? 0 points 0 points  What time? 0 points 0 points  Count back from 20 0 points 0 points  Months in reverse 0 points 0 points  Repeat phrase 0 points 2 points  Total Score 0 2   (Normal:0-7, Significant for Dysfunction: >8)  Normal Cognitive Function Screening: Yes   Immunization & Health Maintenance Record Immunization History  Administered Date(s) Administered  . DTaP 11/21/2009  .  Influenza Split 04/11/2013  . Influenza Whole 04/14/2010  . Influenza,inj,Quad PF,6+ Mos 04/25/2014  . Pneumococcal Conjugate-13 01/21/2015  . Pneumococcal Polysaccharide-23 05/22/1995  . Tdap 01/05/2011    Health Maintenance  Topic Date Due  . COVID-19 Vaccine (1) Never done  . MAMMOGRAM  Never done  . INFLUENZA VACCINE  02/17/2020  . TETANUS/TDAP  01/04/2021  . DEXA SCAN  Completed  . PNA vac Low Risk Adult  Completed       Assessment  This is a routine wellness examination for Nancy Holt.  Health Maintenance: Due or Overdue Health Maintenance Due  Topic Date Due  . COVID-19 Vaccine (1) Never done  . MAMMOGRAM  Never done    Nancy Holt does not need a referral for Community Assistance: Care Management:   no Social Work:    no Prescription Assistance:  no Nutrition/Diabetes Education:  no   Plan:  Personalized Goals Goals Addressed            This Visit's Progress   . Prevent falls        Personalized Health Maintenance & Screening Recommendations   Lung Cancer Screening Recommended: no (Low Dose CT Chest recommended if Age 43-80 years, 30 pack-year currently smoking OR have quit w/in past 15 years) Hepatitis C Screening recommended: no HIV Screening recommended: no  Advanced Directives: Written information was not prepared per patient's request.  Referrals & Orders No orders of the defined types were placed in this encounter.   Follow-up Plan . Follow-up with Bennie Pierini, FNP as planned . Schedule 03/25/2020    I have personally reviewed and noted the following in the patient's chart:   . Medical and social history . Use of alcohol, tobacco or illicit drugs  . Current medications and supplements . Functional ability and status . Nutritional status . Physical activity . Advanced directives . List of other physicians . Hospitalizations, surgeries, and ER visits in previous 12 months . Vitals . Screenings to include  cognitive, depression, and falls . Referrals and appointments  In addition, I have reviewed and discussed with Nancy Holt certain preventive protocols, quality metrics, and best practice recommendations. A written personalized care plan for preventive services as well as general preventive health recommendations is available and  can be mailed to the patient at her request.      Suzan Slick [facility]  [id number]

## 2020-01-23 DIAGNOSIS — Z9889 Other specified postprocedural states: Secondary | ICD-10-CM | POA: Diagnosis not present

## 2020-01-23 DIAGNOSIS — H40831 Aqueous misdirection, right eye: Secondary | ICD-10-CM | POA: Diagnosis not present

## 2020-01-24 ENCOUNTER — Encounter: Payer: Self-pay | Admitting: Family Medicine

## 2020-01-24 ENCOUNTER — Other Ambulatory Visit: Payer: Self-pay

## 2020-01-24 ENCOUNTER — Ambulatory Visit (INDEPENDENT_AMBULATORY_CARE_PROVIDER_SITE_OTHER): Payer: Medicare Other

## 2020-01-24 ENCOUNTER — Ambulatory Visit (INDEPENDENT_AMBULATORY_CARE_PROVIDER_SITE_OTHER): Payer: Medicare Other | Admitting: Family Medicine

## 2020-01-24 VITALS — BP 140/72 | HR 52 | Temp 97.7°F

## 2020-01-24 DIAGNOSIS — M5441 Lumbago with sciatica, right side: Secondary | ICD-10-CM

## 2020-01-24 DIAGNOSIS — R531 Weakness: Secondary | ICD-10-CM | POA: Diagnosis not present

## 2020-01-24 MED ORDER — PREDNISONE 10 MG (21) PO TBPK: ORAL_TABLET | ORAL | 0 refills | Status: DC

## 2020-01-24 NOTE — Progress Notes (Signed)
Assessment & Plan:  1. Acute right-sided low back pain with right-sided sciatica - Education provided on acute low back pain. Encouraged to try heating pad, muscle rub, and Lidoderm patches. - predniSONE (STERAPRED UNI-PAK 21 TAB) 10 MG (21) TBPK tablet; As directed x 6 days  Dispense: 21 tablet; Refill: 0 - DG Lumbar Spine 2-3 Views  2. Generalized weakness - DME Wheelchair manual   Follow up plan: Return if symptoms worsen or fail to improve.  Deliah Boston, MSN, APRN, FNP-C [facility]  Subjective:   Patient ID: Nancy Holt, female    DOB: 12/21/1922, 86 y.o.   MRN: [id number]  HPI: Nancy Holt is a 86 y.o. female presenting on 01/24/2020 for Right hip pain (Patient states she has been having right hip pain that has been going on for 9 days.  Patient states that the pain goes down her leg.)  Patient is accompanied by her daughter who she is okay with being present.  Patient reports right-sided low back and right hip pain that radiates down her right leg. Pain started 9 days ago. She denies any injury or fall. No recent unusual activity. She describes the pain as aching and throbbing. It is constant when she is walking or sitting. She rates the pain 10 out of 10. She does get relief from the pain when she lies down on her right side. She has tried taking Tylenol and Advil, neither of which helped with the pain.  Patient is also requesting a wheelchair due to generalized weakness and difficulty getting around in the house. Unable to use cane or walker due to pain. Unable to use crutches due to instability.   ROS: Negative unless specifically indicated above in HPI.   Relevant past medical history reviewed and updated as indicated.   Allergies and medications reviewed and updated.   Current Outpatient Medications:  .  Cholecalciferol (VITAMIN D) 2000 UNITS CAPS, Take by mouth. One daily  , Disp: , Rfl:  .  diltiazem (CARDIZEM CD) 240 MG 24 hr  capsule, TAKE 1 CAPSULE (240 MG TOTAL) BY MOUTH DAILY. (NEEDS TO BE SEEN BEFORE NEXT REFILL), Disp: 90 capsule, Rfl: 1 .  Flaxseed, Linseed, (FLAX SEED OIL) 1000 MG CAPS, Take by mouth. 3 daily   , Disp: , Rfl:  .  hydrochlorothiazide (HYDRODIURIL) 25 MG tablet, TAKE 1 TABLET BY MOUTH EVERY DAY, Disp: 90 tablet, Rfl: 1 .  levothyroxine (SYNTHROID) 25 MCG tablet, Take 1 tablet (25 mcg total) by mouth every morning., Disp: 90 tablet, Rfl: 1 .  losartan (COZAAR) 100 MG tablet, Take 1 tablet (100 mg total) by mouth daily., Disp: 90 tablet, Rfl: 1  Allergies  Allergen Reactions  . Lipitor [Atorvastatin Calcium]     myalgias  . Lisinopril Cough    Objective:   BP 140/72   Pulse (!) 52   Temp 97.7 F (36.5 C) (Temporal)   SpO2 97%    Physical Exam Vitals reviewed.  Constitutional:      General: She is not in acute distress.    Appearance: Normal appearance. She is not ill-appearing, toxic-appearing or diaphoretic.  HENT:     Head: Normocephalic and atraumatic.  Eyes:     General: No scleral icterus.       Right eye: No discharge.        Left eye: No discharge.     Conjunctiva/sclera: Conjunctivae normal.  Cardiovascular:     Rate and Rhythm: Normal rate.  Pulmonary:  Effort: Pulmonary effort is normal. No respiratory distress.  Musculoskeletal:     Cervical back: Normal range of motion.     Lumbar back: Tenderness present. No swelling, edema, deformity, signs of trauma, lacerations, spasms or bony tenderness. Normal range of motion. Positive right straight leg raise test. Negative left straight leg raise test. No scoliosis.     Comments: Manipulation of right hip causes pain in low back on right side.   Skin:    General: Skin is warm and dry.     Capillary Refill: Capillary refill takes less than 2 seconds.  Neurological:     General: No focal deficit present.     Mental Status: She is alert and oriented to person, place, and time. Mental status is at baseline.    Psychiatric:        Mood and Affect: Mood normal.        Behavior: Behavior normal.        Thought Content: Thought content normal.        Judgment: Judgment normal.

## 2020-01-24 NOTE — Patient Instructions (Signed)
Muscle rub such as Biofreeze or [place] Gel Heating pad Lidoderm patches  Acute Back Pain, Adult Acute back pain is sudden and usually short-lived. It is often caused by an injury to the muscles and tissues in the back. The injury may result from:  A muscle or ligament getting overstretched or torn (strained). Ligaments are tissues that connect bones to each other. Lifting something improperly can cause a back strain.  Wear and tear (degeneration) of the spinal disks. Spinal disks are circular tissue that provides cushioning between the bones of the spine (vertebrae).  Twisting motions, such as while playing sports or doing yard work.  A hit to the back.  Arthritis. You may have a physical exam, lab tests, and imaging tests to find the cause of your pain. Acute back pain usually goes away with rest and home care. Follow these instructions at home: Managing pain, stiffness, and swelling  Take over-the-counter and prescription medicines only as told by your health care provider.  Your health care provider may recommend applying ice during the first 24-48 hours after your pain starts. To do this: ? Put ice in a plastic bag. ? Place a towel between your skin and the bag. ? Leave the ice on for 20 minutes, 2-3 times a day.  If directed, apply heat to the affected area as often as told by your health care provider. Use the heat source that your health care provider recommends, such as a moist heat pack or a heating pad. ? Place a towel between your skin and the heat source. ? Leave the heat on for 20-30 minutes. ? Remove the heat if your skin turns bright red. This is especially important if you are unable to feel pain, heat, or cold. You have a greater risk of getting burned. Activity   Do not stay in bed. Staying in bed for more than 1-2 days can delay your recovery.  Sit up and stand up straight. Avoid leaning forward when you sit, or hunching over when you stand. ? If you work at a  desk, sit close to it so you do not need to lean over. Keep your chin tucked in. Keep your neck drawn back, and keep your elbows bent at a right angle. Your arms should look like the letter "L." ? Sit high and close to the steering wheel when you drive. Add lower back (lumbar) support to your car seat, if needed.  Take short walks on even surfaces as soon as you are able. Try to increase the length of time you walk each day.  Do not sit, drive, or stand in one place for more than 30 minutes at a time. Sitting or standing for long periods of time can put stress on your back.  Do not drive or use heavy machinery while taking prescription pain medicine.  Use proper lifting techniques. When you bend and lift, use positions that put less stress on your back: ? [place] your knees. ? Keep the load close to your body. ? Avoid twisting.  Exercise regularly as told by your health care provider. Exercising helps your back heal faster and helps prevent back injuries by keeping muscles strong and flexible.  Work with a physical therapist to make a safe exercise program, as recommended by your health care provider. Do any exercises as told by your physical therapist. Lifestyle  Maintain a healthy weight. Extra weight puts stress on your back and makes it difficult to have good posture.  Avoid activities or  situations that make you feel anxious or stressed. Stress and anxiety increase muscle tension and can make back pain worse. Learn ways to manage anxiety and stress, such as through exercise. General instructions  Sleep on a firm mattress in a comfortable position. Try lying on your side with your knees slightly bent. If you lie on your back, put a pillow under your knees.  Follow your treatment plan as told by your health care provider. This may include: ? Cognitive or behavioral therapy. ? Acupuncture or massage therapy. ? Meditation or yoga. Contact a health care provider if:  You have pain that  is not relieved with rest or medicine.  You have increasing pain going down into your legs or buttocks.  Your pain does not improve after 2 weeks.  You have pain at night.  You lose weight without trying.  You have a fever or chills. Get help right away if:  You develop new bowel or bladder control problems.  You have unusual weakness or numbness in your arms or legs.  You develop nausea or vomiting.  You develop abdominal pain.  You feel faint. Summary  Acute back pain is sudden and usually short-lived.  Use proper lifting techniques. When you bend and lift, use positions that put less stress on your back.  Take over-the-counter and prescription medicines and apply heat or ice as directed by your health care provider. This information is not intended to replace advice given to you by your health care provider. Make sure you discuss any questions you have with your health care provider. Document Revised: 10/24/2018 Document Reviewed: 02/16/2017 Elsevier Patient Education  [place].

## 2020-01-25 DIAGNOSIS — M545 Low back pain: Secondary | ICD-10-CM | POA: Diagnosis not present

## 2020-01-28 ENCOUNTER — Telehealth: Payer: Self-pay | Admitting: Nurse Practitioner

## 2020-01-28 ENCOUNTER — Other Ambulatory Visit: Payer: Self-pay | Admitting: Family Medicine

## 2020-01-28 DIAGNOSIS — M5441 Lumbago with sciatica, right side: Secondary | ICD-10-CM

## 2020-01-28 DIAGNOSIS — S32040A Wedge compression fracture of fourth lumbar vertebra, initial encounter for closed fracture: Secondary | ICD-10-CM

## 2020-01-28 NOTE — Telephone Encounter (Signed)
Patient has been taking prednisone and it is making her sick.  Requesting something else sent to pharmacy.  Please advise - covering pcp

## 2020-01-28 NOTE — Telephone Encounter (Signed)
Would you be willing to take a look at this one, since you just saw the patient?  Thanks, Broadus John

## 2020-01-29 ENCOUNTER — Telehealth: Payer: Self-pay | Admitting: Nurse Practitioner

## 2020-01-29 MED ORDER — TRAMADOL HCL 50 MG PO TABS: 50.0000 mg | ORAL_TABLET | Freq: Two times a day (BID) | ORAL | 0 refills | Status: AC | PRN

## 2020-01-29 NOTE — Telephone Encounter (Signed)
I recommend we switch over to Tramadol to try to give her some pain relief. Is she agreeable?

## 2020-01-29 NOTE — Telephone Encounter (Signed)
Deferring to the provider that saw her

## 2020-01-29 NOTE — Telephone Encounter (Signed)
Pt aware rx sent over to pharmacy. °

## 2020-01-29 NOTE — Telephone Encounter (Signed)
Order faxed to [facility],confirmation received  

## 2020-01-29 NOTE — Telephone Encounter (Signed)
Pt states she would be fine with trying the Tramadol and to send it into [facility] in [place].

## 2020-01-29 NOTE — Telephone Encounter (Signed)
Sent to [facility]

## 2020-01-30 ENCOUNTER — Telehealth: Payer: Self-pay | Admitting: Nurse Practitioner

## 2020-01-30 NOTE — Telephone Encounter (Signed)
VMB not set up 

## 2020-01-30 NOTE — Telephone Encounter (Signed)
She will call back to make appointment she was not ready to make right now

## 2020-01-30 NOTE — Telephone Encounter (Signed)
Ntbs for labs before we can do antiinflammatory. Also need s to be seen  to have PT ordered

## 2020-01-30 NOTE — Telephone Encounter (Signed)
  REFERRAL REQUEST Telephone Note 01/30/2020  What type of referral do you need? Physical Therapy  Why do you need this referral?  Have you been seen at our office for this problem? Yes (Advise that they may need an appointment with their PCP before a referral can be done)  Is there a particular doctor or location that you prefer?  Patient notified that referrals can take up to a week or longer to process. If they haven't heard anything within a week they should call back and speak with the referral department.   MMM's pt.  Pt has been waiting on someone to call her about this, so please call daughter today & let her know the status of referral.

## 2020-01-31 ENCOUNTER — Telehealth: Payer: Self-pay | Admitting: Nurse Practitioner

## 2020-01-31 NOTE — Telephone Encounter (Signed)
  Prescription Request  01/31/2020  What is the name of the medication or equipment? Pt needs regular transport wheelchair  Have you contacted your pharmacy to request a refill? (if applicable) no  Which pharmacy would you like this sent to? [facility]   Patient notified that their request is being sent to the clinical staff for review and that they should receive a response within 2 business days.

## 2020-02-01 DIAGNOSIS — K5900 Constipation, unspecified: Secondary | ICD-10-CM | POA: Diagnosis not present

## 2020-02-01 DIAGNOSIS — R109 Unspecified abdominal pain: Secondary | ICD-10-CM | POA: Diagnosis not present

## 2020-02-01 DIAGNOSIS — R34 Anuria and oliguria: Secondary | ICD-10-CM | POA: Diagnosis not present

## 2020-02-01 NOTE — Telephone Encounter (Signed)
Pt returned missed call from [facility] regarding order requesting for transport wheelchair. Says we have already sent an order for it to [facility] but says the wrong wheelchair was listed on the order. Pt needs [place] to void that order and resend order to them for a "Regular Transport Wheelchair"

## 2020-02-01 NOTE — Telephone Encounter (Signed)
Pts daughter called back stating that she knows her mom called this morning about getting order for wheelchair corrected and resent but says pt needs both "Regular Transport Wheelchair" and "Swivel Lift Wheelchair" orders sent to [facility] if MMM is able to do so.

## 2020-02-04 NOTE — Telephone Encounter (Signed)
Returning call about setting up at home PT.

## 2020-02-05 DIAGNOSIS — Z9889 Other specified postprocedural states: Secondary | ICD-10-CM | POA: Diagnosis not present

## 2020-02-05 DIAGNOSIS — H40831 Aqueous misdirection, right eye: Secondary | ICD-10-CM | POA: Diagnosis not present

## 2020-02-05 NOTE — Telephone Encounter (Signed)
Son aware it is being worked on.

## 2020-02-05 NOTE — Telephone Encounter (Signed)
Can you check on this?

## 2020-02-06 ENCOUNTER — Telehealth: Payer: Self-pay | Admitting: Nurse Practitioner

## 2020-02-06 ENCOUNTER — Other Ambulatory Visit: Payer: Self-pay | Admitting: Nurse Practitioner

## 2020-02-06 DIAGNOSIS — E034 Atrophy of thyroid (acquired): Secondary | ICD-10-CM

## 2020-02-06 DIAGNOSIS — I10 Essential (primary) hypertension: Secondary | ICD-10-CM

## 2020-02-06 NOTE — Telephone Encounter (Signed)
Form from [facility] placed on providers desk

## 2020-02-06 NOTE — Telephone Encounter (Signed)
Can you take care of this?

## 2020-02-06 NOTE — Telephone Encounter (Signed)
OV 03/25/20 

## 2020-02-11 ENCOUNTER — Telehealth: Payer: Self-pay | Admitting: Nurse Practitioner

## 2020-02-11 DIAGNOSIS — R2681 Unsteadiness on feet: Secondary | ICD-10-CM

## 2020-02-11 DIAGNOSIS — M5441 Lumbago with sciatica, right side: Secondary | ICD-10-CM

## 2020-02-11 DIAGNOSIS — R531 Weakness: Secondary | ICD-10-CM

## 2020-02-11 DIAGNOSIS — S32040A Wedge compression fracture of fourth lumbar vertebra, initial encounter for closed fracture: Secondary | ICD-10-CM

## 2020-02-11 NOTE — Telephone Encounter (Signed)
Form received back from provider, will be faxed to [facility]

## 2020-02-11 NOTE — Telephone Encounter (Signed)
LMOVM trying to place pt with a HH agency, some d/t have the staffing others are not taking certain insurances. Still working on this referral

## 2020-02-14 DIAGNOSIS — Z9181 History of falling: Secondary | ICD-10-CM | POA: Diagnosis not present

## 2020-02-14 DIAGNOSIS — I700 Atherosclerosis of aorta: Secondary | ICD-10-CM | POA: Diagnosis not present

## 2020-02-14 DIAGNOSIS — S32040D Wedge compression fracture of fourth lumbar vertebra, subsequent encounter for fracture with routine healing: Secondary | ICD-10-CM | POA: Diagnosis not present

## 2020-02-14 DIAGNOSIS — M8580 Other specified disorders of bone density and structure, unspecified site: Secondary | ICD-10-CM | POA: Diagnosis not present

## 2020-02-14 DIAGNOSIS — M5441 Lumbago with sciatica, right side: Secondary | ICD-10-CM | POA: Diagnosis not present

## 2020-02-14 DIAGNOSIS — M5136 Other intervertebral disc degeneration, lumbar region: Secondary | ICD-10-CM | POA: Diagnosis not present

## 2020-02-15 NOTE — Telephone Encounter (Signed)
Form re-pulled from 02/12/20 and refaxed

## 2020-02-15 NOTE — Telephone Encounter (Signed)
Please review

## 2020-02-15 NOTE — Telephone Encounter (Signed)
[facility] stated to pts daughter that they have only received 2 rxs for a wheelchair. She is needing a rx for a transport chair. Please call when faxed.

## 2020-02-18 ENCOUNTER — Telehealth: Payer: Self-pay | Admitting: Nurse Practitioner

## 2020-02-18 DIAGNOSIS — S32040D Wedge compression fracture of fourth lumbar vertebra, subsequent encounter for fracture with routine healing: Secondary | ICD-10-CM | POA: Diagnosis not present

## 2020-02-18 DIAGNOSIS — Z9181 History of falling: Secondary | ICD-10-CM | POA: Diagnosis not present

## 2020-02-18 DIAGNOSIS — M8580 Other specified disorders of bone density and structure, unspecified site: Secondary | ICD-10-CM | POA: Diagnosis not present

## 2020-02-18 DIAGNOSIS — M5136 Other intervertebral disc degeneration, lumbar region: Secondary | ICD-10-CM | POA: Diagnosis not present

## 2020-02-18 DIAGNOSIS — M5441 Lumbago with sciatica, right side: Secondary | ICD-10-CM | POA: Diagnosis not present

## 2020-02-18 DIAGNOSIS — I700 Atherosclerosis of aorta: Secondary | ICD-10-CM | POA: Diagnosis not present

## 2020-02-20 DIAGNOSIS — I700 Atherosclerosis of aorta: Secondary | ICD-10-CM | POA: Diagnosis not present

## 2020-02-20 DIAGNOSIS — M8580 Other specified disorders of bone density and structure, unspecified site: Secondary | ICD-10-CM | POA: Diagnosis not present

## 2020-02-20 DIAGNOSIS — M5441 Lumbago with sciatica, right side: Secondary | ICD-10-CM | POA: Diagnosis not present

## 2020-02-20 DIAGNOSIS — Z9181 History of falling: Secondary | ICD-10-CM | POA: Diagnosis not present

## 2020-02-20 DIAGNOSIS — S32040D Wedge compression fracture of fourth lumbar vertebra, subsequent encounter for fracture with routine healing: Secondary | ICD-10-CM | POA: Diagnosis not present

## 2020-02-20 DIAGNOSIS — M5136 Other intervertebral disc degeneration, lumbar region: Secondary | ICD-10-CM | POA: Diagnosis not present

## 2020-02-26 DIAGNOSIS — M5136 Other intervertebral disc degeneration, lumbar region: Secondary | ICD-10-CM | POA: Diagnosis not present

## 2020-02-26 DIAGNOSIS — M8580 Other specified disorders of bone density and structure, unspecified site: Secondary | ICD-10-CM | POA: Diagnosis not present

## 2020-02-26 DIAGNOSIS — I700 Atherosclerosis of aorta: Secondary | ICD-10-CM | POA: Diagnosis not present

## 2020-02-26 DIAGNOSIS — M5441 Lumbago with sciatica, right side: Secondary | ICD-10-CM | POA: Diagnosis not present

## 2020-02-26 DIAGNOSIS — S32040D Wedge compression fracture of fourth lumbar vertebra, subsequent encounter for fracture with routine healing: Secondary | ICD-10-CM | POA: Diagnosis not present

## 2020-02-26 DIAGNOSIS — Z9181 History of falling: Secondary | ICD-10-CM | POA: Diagnosis not present

## 2020-02-29 DIAGNOSIS — M5441 Lumbago with sciatica, right side: Secondary | ICD-10-CM | POA: Diagnosis not present

## 2020-02-29 DIAGNOSIS — Z9181 History of falling: Secondary | ICD-10-CM | POA: Diagnosis not present

## 2020-02-29 DIAGNOSIS — M5136 Other intervertebral disc degeneration, lumbar region: Secondary | ICD-10-CM | POA: Diagnosis not present

## 2020-02-29 DIAGNOSIS — S32040D Wedge compression fracture of fourth lumbar vertebra, subsequent encounter for fracture with routine healing: Secondary | ICD-10-CM | POA: Diagnosis not present

## 2020-02-29 DIAGNOSIS — I700 Atherosclerosis of aorta: Secondary | ICD-10-CM | POA: Diagnosis not present

## 2020-02-29 DIAGNOSIS — M8580 Other specified disorders of bone density and structure, unspecified site: Secondary | ICD-10-CM | POA: Diagnosis not present

## 2020-03-03 DIAGNOSIS — M5136 Other intervertebral disc degeneration, lumbar region: Secondary | ICD-10-CM | POA: Diagnosis not present

## 2020-03-03 DIAGNOSIS — I700 Atherosclerosis of aorta: Secondary | ICD-10-CM | POA: Diagnosis not present

## 2020-03-03 DIAGNOSIS — M8580 Other specified disorders of bone density and structure, unspecified site: Secondary | ICD-10-CM | POA: Diagnosis not present

## 2020-03-03 DIAGNOSIS — Z9181 History of falling: Secondary | ICD-10-CM | POA: Diagnosis not present

## 2020-03-03 DIAGNOSIS — M5441 Lumbago with sciatica, right side: Secondary | ICD-10-CM | POA: Diagnosis not present

## 2020-03-03 DIAGNOSIS — S32040D Wedge compression fracture of fourth lumbar vertebra, subsequent encounter for fracture with routine healing: Secondary | ICD-10-CM | POA: Diagnosis not present

## 2020-03-05 DIAGNOSIS — M8580 Other specified disorders of bone density and structure, unspecified site: Secondary | ICD-10-CM | POA: Diagnosis not present

## 2020-03-05 DIAGNOSIS — M5441 Lumbago with sciatica, right side: Secondary | ICD-10-CM | POA: Diagnosis not present

## 2020-03-05 DIAGNOSIS — I700 Atherosclerosis of aorta: Secondary | ICD-10-CM | POA: Diagnosis not present

## 2020-03-05 DIAGNOSIS — S32040D Wedge compression fracture of fourth lumbar vertebra, subsequent encounter for fracture with routine healing: Secondary | ICD-10-CM | POA: Diagnosis not present

## 2020-03-05 DIAGNOSIS — M5136 Other intervertebral disc degeneration, lumbar region: Secondary | ICD-10-CM | POA: Diagnosis not present

## 2020-03-05 DIAGNOSIS — Z9181 History of falling: Secondary | ICD-10-CM | POA: Diagnosis not present

## 2020-03-10 DIAGNOSIS — S32040D Wedge compression fracture of fourth lumbar vertebra, subsequent encounter for fracture with routine healing: Secondary | ICD-10-CM | POA: Diagnosis not present

## 2020-03-10 DIAGNOSIS — M5441 Lumbago with sciatica, right side: Secondary | ICD-10-CM | POA: Diagnosis not present

## 2020-03-10 DIAGNOSIS — M8580 Other specified disorders of bone density and structure, unspecified site: Secondary | ICD-10-CM | POA: Diagnosis not present

## 2020-03-10 DIAGNOSIS — Z9181 History of falling: Secondary | ICD-10-CM | POA: Diagnosis not present

## 2020-03-10 DIAGNOSIS — I700 Atherosclerosis of aorta: Secondary | ICD-10-CM | POA: Diagnosis not present

## 2020-03-10 DIAGNOSIS — M5136 Other intervertebral disc degeneration, lumbar region: Secondary | ICD-10-CM | POA: Diagnosis not present

## 2020-03-12 ENCOUNTER — Other Ambulatory Visit: Payer: Self-pay | Admitting: Nurse Practitioner

## 2020-03-12 DIAGNOSIS — I10 Essential (primary) hypertension: Secondary | ICD-10-CM

## 2020-03-12 NOTE — Telephone Encounter (Signed)
Pts daughter wanting to make sure this was sent to [facility]. They have not received the order per daughter. Please advise and please call daughter at [phone]-[facility]

## 2020-03-14 DIAGNOSIS — M8580 Other specified disorders of bone density and structure, unspecified site: Secondary | ICD-10-CM | POA: Diagnosis not present

## 2020-03-14 DIAGNOSIS — I700 Atherosclerosis of aorta: Secondary | ICD-10-CM | POA: Diagnosis not present

## 2020-03-14 DIAGNOSIS — Z9181 History of falling: Secondary | ICD-10-CM | POA: Diagnosis not present

## 2020-03-14 DIAGNOSIS — S32040D Wedge compression fracture of fourth lumbar vertebra, subsequent encounter for fracture with routine healing: Secondary | ICD-10-CM | POA: Diagnosis not present

## 2020-03-14 DIAGNOSIS — M5136 Other intervertebral disc degeneration, lumbar region: Secondary | ICD-10-CM | POA: Diagnosis not present

## 2020-03-14 DIAGNOSIS — M5441 Lumbago with sciatica, right side: Secondary | ICD-10-CM | POA: Diagnosis not present

## 2020-03-18 ENCOUNTER — Ambulatory Visit (INDEPENDENT_AMBULATORY_CARE_PROVIDER_SITE_OTHER): Payer: Medicare Other

## 2020-03-18 ENCOUNTER — Other Ambulatory Visit: Payer: Self-pay

## 2020-03-18 DIAGNOSIS — M5441 Lumbago with sciatica, right side: Secondary | ICD-10-CM

## 2020-03-18 DIAGNOSIS — Z9181 History of falling: Secondary | ICD-10-CM

## 2020-03-18 DIAGNOSIS — M5136 Other intervertebral disc degeneration, lumbar region: Secondary | ICD-10-CM

## 2020-03-18 DIAGNOSIS — M8580 Other specified disorders of bone density and structure, unspecified site: Secondary | ICD-10-CM | POA: Diagnosis not present

## 2020-03-18 DIAGNOSIS — S32040D Wedge compression fracture of fourth lumbar vertebra, subsequent encounter for fracture with routine healing: Secondary | ICD-10-CM

## 2020-03-18 DIAGNOSIS — I700 Atherosclerosis of aorta: Secondary | ICD-10-CM

## 2020-03-18 NOTE — Telephone Encounter (Signed)
Pt aware new form being faxed to [place], then they will be in contact with them.

## 2020-03-18 NOTE — Telephone Encounter (Signed)
[facility] was able to locate today the order for the transport chair, there where several accessories that were marked that need to be changed New form to be faxed, only thing that needs to be marked is the transport chair, no accessories.

## 2020-03-21 DIAGNOSIS — M5136 Other intervertebral disc degeneration, lumbar region: Secondary | ICD-10-CM | POA: Diagnosis not present

## 2020-03-21 DIAGNOSIS — Z9181 History of falling: Secondary | ICD-10-CM | POA: Diagnosis not present

## 2020-03-21 DIAGNOSIS — S32040D Wedge compression fracture of fourth lumbar vertebra, subsequent encounter for fracture with routine healing: Secondary | ICD-10-CM | POA: Diagnosis not present

## 2020-03-21 DIAGNOSIS — I700 Atherosclerosis of aorta: Secondary | ICD-10-CM | POA: Diagnosis not present

## 2020-03-21 DIAGNOSIS — M8580 Other specified disorders of bone density and structure, unspecified site: Secondary | ICD-10-CM | POA: Diagnosis not present

## 2020-03-21 DIAGNOSIS — M5441 Lumbago with sciatica, right side: Secondary | ICD-10-CM | POA: Diagnosis not present

## 2020-03-21 NOTE — Telephone Encounter (Signed)
Pts daughter calling to check status on the wheelchair being ordered. [employer] [facility] is telling them they have not received the info.

## 2020-03-21 NOTE — Addendum Note (Signed)
Addended by: Magdalene River on: 03/21/2020 02:07 PM   Modules accepted: Orders

## 2020-03-21 NOTE — Telephone Encounter (Signed)
New order for lightweight manual wheelchair ordered, signed and faxed to [employer] - daughter aware.

## 2020-03-25 ENCOUNTER — Ambulatory Visit (INDEPENDENT_AMBULATORY_CARE_PROVIDER_SITE_OTHER): Payer: Medicare Other | Admitting: Nurse Practitioner

## 2020-03-25 ENCOUNTER — Encounter: Payer: Self-pay | Admitting: Nurse Practitioner

## 2020-03-25 ENCOUNTER — Other Ambulatory Visit: Payer: Self-pay

## 2020-03-25 VITALS — BP 136/71 | HR 53 | Temp 97.3°F | Resp 20

## 2020-03-25 DIAGNOSIS — I2510 Atherosclerotic heart disease of native coronary artery without angina pectoris: Secondary | ICD-10-CM | POA: Diagnosis not present

## 2020-03-25 DIAGNOSIS — Z9989 Dependence on other enabling machines and devices: Secondary | ICD-10-CM

## 2020-03-25 DIAGNOSIS — K5790 Diverticulosis of intestine, part unspecified, without perforation or abscess without bleeding: Secondary | ICD-10-CM

## 2020-03-25 DIAGNOSIS — E034 Atrophy of thyroid (acquired): Secondary | ICD-10-CM | POA: Diagnosis not present

## 2020-03-25 DIAGNOSIS — J410 Simple chronic bronchitis: Secondary | ICD-10-CM

## 2020-03-25 DIAGNOSIS — I10 Essential (primary) hypertension: Secondary | ICD-10-CM

## 2020-03-25 DIAGNOSIS — K219 Gastro-esophageal reflux disease without esophagitis: Secondary | ICD-10-CM | POA: Diagnosis not present

## 2020-03-25 DIAGNOSIS — Z6829 Body mass index (BMI) 29.0-29.9, adult: Secondary | ICD-10-CM

## 2020-03-25 DIAGNOSIS — I872 Venous insufficiency (chronic) (peripheral): Secondary | ICD-10-CM

## 2020-03-25 DIAGNOSIS — E782 Mixed hyperlipidemia: Secondary | ICD-10-CM

## 2020-03-25 DIAGNOSIS — G4733 Obstructive sleep apnea (adult) (pediatric): Secondary | ICD-10-CM

## 2020-03-25 MED ORDER — LOSARTAN POTASSIUM 100 MG PO TABS: 100.0000 mg | ORAL_TABLET | Freq: Every day | ORAL | 1 refills | Status: AC

## 2020-03-25 MED ORDER — LEVOTHYROXINE SODIUM 25 MCG PO TABS: 25.0000 ug | ORAL_TABLET | Freq: Every morning | ORAL | 1 refills | Status: AC

## 2020-03-25 MED ORDER — DILTIAZEM HCL ER COATED BEADS 240 MG PO CP24: 240.0000 mg | ORAL_CAPSULE | Freq: Every day | ORAL | 0 refills | Status: DC

## 2020-03-25 MED ORDER — HYDROCHLOROTHIAZIDE 25 MG PO TABS: 25.0000 mg | ORAL_TABLET | Freq: Every day | ORAL | 1 refills | Status: AC

## 2020-03-25 NOTE — Patient Instructions (Signed)

## 2020-03-25 NOTE — Progress Notes (Signed)
Subjective:    Patient ID: Nancy Holt, female    DOB: 02/21/1923, 86 y.o.   MRN: [id number]   Chief Complaint: Medical Management of Chronic Issues    HPI:  1. Essential hypertension No c/o chest pain, sob or headache. Does not check blood pressure at home. BP Readings from Last 3 Encounters:  01/24/20 140/72  03/27/19 134/77  04/11/18 (!) 153/72     2. Mixed hyperlipidemia Does not watch diet and does very little exercise. Lab Results  Component Value Date   CHOL 207 (H) 04/11/2018   HDL 57 04/11/2018   LDLCALC 128 (H) 04/11/2018   TRIG 110 04/11/2018   CHOLHDL 3.6 04/11/2018     3. Hypothyroidism due to acquired atrophy of thyroid No problems that she is aware of. Lab Results  Component Value Date   TSH 3.280 04/11/2018     4. Gastroesophageal reflux disease without esophagitis Is currently on no prescription meds for this. Occasionally has symptoms.  5. Coronary artery disease involving native coronary artery of native heart without angina pectoris Has not seen cardiology in several years  6. Venous (peripheral) insufficiency Has occasional peripheral edema  7. Simple chronic bronchitis (HCC) Slight cough but does not bother her.  8. OSA on CPAP No longer wears  CPAP nighlty  9. Diverticulosis No recent flare ups  10. BMI 29.0-29.9,adult She has lost a few more lbs she says even though she could not weight today. Unable to weigh today. Wt Readings from Last 3 Encounters:  03/27/19 151 lb (68.5 kg)  04/11/18 162 lb 12.8 oz (73.8 kg)  06/24/17 168 lb (76.2 kg)   BMI Readings from Last 3 Encounters:  03/27/19 25.13 kg/m  04/11/18 27.94 kg/m  06/24/17 28.84 kg/m       Outpatient Encounter Medications as of 03/25/2020  Medication Sig  . Cholecalciferol (VITAMIN D) 2000 UNITS CAPS Take by mouth. One daily    . diltiazem (CARDIZEM CD) 240 MG 24 hr capsule Take 1 capsule (240 mg total) by mouth daily.  . Flaxseed, Linseed, (FLAX SEED  OIL) 1000 MG CAPS Take by mouth. 3 daily     . hydrochlorothiazide (HYDRODIURIL) 25 MG tablet TAKE 1 TABLET BY MOUTH EVERY DAY  . levothyroxine (SYNTHROID) 25 MCG tablet TAKE 1 TABLET BY MOUTH EVERY MORNING  . losartan (COZAAR) 100 MG tablet TAKE 1 TABLET BY MOUTH EVERY DAY  . predniSONE (STERAPRED UNI-PAK 21 TAB) 10 MG (21) TBPK tablet As directed x 6 days  . traMADol (ULTRAM) 50 MG tablet Take 1 tablet (50 mg total) by mouth 2 (two) times daily as needed.    History reviewed. No pertinent surgical history.  Family History  Problem Relation Age of Onset  . Aneurysm Father     New complaints: None today  Social history: Lives by herself. Her family checks on her daily and she has a caregiver that comes a couple of times a week.  Controlled substance contract: n/a    Review of Systems  Constitutional: Negative for diaphoresis.  Eyes: Negative for pain.  Respiratory: Negative for shortness of breath.   Cardiovascular: Negative for chest pain, palpitations and leg swelling.  Gastrointestinal: Negative for abdominal pain.  Endocrine: Negative for polydipsia.  Musculoskeletal: Positive for back pain (chronic).  Skin: Negative for rash.  Neurological: Negative for dizziness, weakness and headaches.  Hematological: Does not bruise/bleed easily.  All other systems reviewed and are negative.      Objective:   Physical Exam Vitals and  nursing note reviewed.  Constitutional:      General: She is not in acute distress.    Appearance: Normal appearance. She is well-developed.     Comments: Unable to get on exam table today.  HENT:     Head: Normocephalic.     Nose: Nose normal.  Eyes:     Pupils: Pupils are equal, round, and reactive to light.  Neck:     Vascular: No carotid bruit or JVD.  Cardiovascular:     Rate and Rhythm: Normal rate and regular rhythm.     Heart sounds: Normal heart sounds.  Pulmonary:     Effort: Pulmonary effort is normal. No respiratory  distress.     Breath sounds: Normal breath sounds. No wheezing or rales.  Chest:     Chest wall: No tenderness.  Abdominal:     General: Bowel sounds are normal. There is no distension or abdominal bruit.     Palpations: Abdomen is soft. There is no hepatomegaly, splenomegaly, mass or pulsatile mass.     Tenderness: There is no abdominal tenderness.  Musculoskeletal:        General: Normal range of motion.     Cervical back: Normal range of motion and neck supple.     Comments: In wheel chair at visit.  Lymphadenopathy:     Cervical: No cervical adenopathy.  Skin:    General: Skin is warm and dry.  Neurological:     Mental Status: She is alert and oriented to person, place, and time.     Deep Tendon Reflexes: Reflexes are normal and symmetric.  Psychiatric:        Behavior: Behavior normal.        Thought Content: Thought content normal.        Judgment: Judgment normal.     BP 136/71   Pulse (!) 53   Temp (!) 97.3 F (36.3 C) (Temporal)   Resp 20   SpO2 98%       Assessment & Plan:  Nancy Holt comes in today with chief complaint of Medical Management of Chronic Issues   Diagnosis and orders addressed:  1. Essential hypertension Low sodium diet - diltiazem (CARDIZEM CD) 240 MG 24 hr capsule; Take 1 capsule (240 mg total) by mouth daily.  Dispense: 90 capsule; Refill: 0 - losartan (COZAAR) 100 MG tablet; Take 1 tablet (100 mg total) by mouth daily.  Dispense: 90 tablet; Refill: 1 - hydrochlorothiazide (HYDRODIURIL) 25 MG tablet; Take 1 tablet (25 mg total) by mouth daily.  Dispense: 90 tablet; Refill: 1 - CBC with Differential/Platelet - CMP14+EGFR  2. Mixed hyperlipidemia Low fat diet - Lipid panel  3. Hypothyroidism due to acquired atrophy of thyroid Labs pending - levothyroxine (SYNTHROID) 25 MCG tablet; Take 1 tablet (25 mcg total) by mouth every morning.  Dispense: 90 tablet; Refill: 1 - Thyroid Panel With TSH  4. Gastroesophageal reflux disease  without esophagitis Avoid spicy foods Do not eat 2 hours prior to bedtime  5. Coronary artery disease involving native coronary artery of native heart without angina pectoris Will report any SOB or chest pain  6. Venous (peripheral) insufficiency Elevate legs when sitting  7. Simple chronic bronchitis ([place])  8. OSA on CPAP  9. Diverticulosis  10. BMI 29.0-29.9,adult Discussed diet and exercise for person with BMI >25 Will recheck weight in 3-6 months   Labs pending Health Maintenance reviewed Diet and exercise encouraged  Follow up plan: 6 months   Mary-Margaret Hassell Done, FNP

## 2020-03-26 ENCOUNTER — Telehealth: Payer: Self-pay | Admitting: Nurse Practitioner

## 2020-03-26 LAB — CBC WITH DIFFERENTIAL/PLATELET
Basophils Absolute: 0.1 10*3/uL (ref 0.0–0.2)
Basos: 1 %
EOS (ABSOLUTE): 0.1 10*3/uL (ref 0.0–0.4)
Eos: 1 %
Hematocrit: 38 % (ref 34.0–46.6)
Hemoglobin: 13.1 g/dL (ref 11.1–15.9)
Immature Grans (Abs): 0 10*3/uL (ref 0.0–0.1)
Immature Granulocytes: 0 %
Lymphocytes Absolute: 1.1 10*3/uL (ref 0.7–3.1)
Lymphs: 14 %
MCH: 29.4 pg (ref 26.6–33.0)
MCHC: 34.5 g/dL (ref 31.5–35.7)
MCV: 85 fL (ref 79–97)
Monocytes Absolute: 0.7 10*3/uL (ref 0.1–0.9)
Monocytes: 9 %
Neutrophils Absolute: 6.1 10*3/uL (ref 1.4–7.0)
Neutrophils: 75 %
Platelets: 279 10*3/uL (ref 150–450)
RBC: 4.46 x10E6/uL (ref 3.77–5.28)
RDW: 13.6 % (ref 11.7–15.4)
WBC: 8.1 10*3/uL (ref 3.4–10.8)

## 2020-03-26 LAB — LIPID PANEL
Chol/HDL Ratio: 3.1 ratio (ref 0.0–4.4)
Cholesterol, Total: 170 mg/dL (ref 100–199)
HDL: 54 mg/dL (ref >39)
LDL Chol Calc (NIH): 101 mg/dL — ABNORMAL HIGH (ref 0–99)
Triglycerides: 78 mg/dL (ref 0–149)
VLDL Cholesterol Cal: 15 mg/dL (ref 5–40)

## 2020-03-26 LAB — CMP14+EGFR
ALT: 16 [IU]/L (ref 0–32)
AST: 18 [IU]/L (ref 0–40)
Albumin/Globulin Ratio: 1.7 (ref 1.2–2.2)
Albumin: 3.9 g/dL (ref 3.5–4.6)
Alkaline Phosphatase: 26 [IU]/L — ABNORMAL LOW (ref 48–121)
BUN/Creatinine Ratio: 26 (ref 12–28)
BUN: 26 mg/dL (ref 10–36)
Bilirubin Total: 0.4 mg/dL (ref 0.0–1.2)
CO2: 24 mmol/L (ref 20–29)
Calcium: 9.7 mg/dL (ref 8.7–10.3)
Chloride: 91 mmol/L — ABNORMAL LOW (ref 96–106)
Creatinine, Ser: 1.01 mg/dL — ABNORMAL HIGH (ref 0.57–1.00)
GFR calc Af Amer: 55 mL/min/{1.73_m2} — ABNORMAL LOW
GFR calc non Af Amer: 47 mL/min/{1.73_m2} — ABNORMAL LOW
Globulin, Total: 2.3 g/dL (ref 1.5–4.5)
Glucose: 108 mg/dL — ABNORMAL HIGH (ref 65–99)
Potassium: 4.1 mmol/L (ref 3.5–5.2)
Sodium: 127 mmol/L — ABNORMAL LOW (ref 134–144)
Total Protein: 6.2 g/dL (ref 6.0–8.5)

## 2020-03-26 LAB — THYROID PANEL WITH TSH
Free Thyroxine Index: 2.4 (ref 1.2–4.9)
T3 Uptake Ratio: 34 % (ref 24–39)
T4, Total: 7.1 ug/dL (ref 4.5–12.0)
TSH: 2.7 u[IU]/mL (ref 0.450–4.500)

## 2020-03-26 NOTE — Telephone Encounter (Signed)
I called Nancy Holt  I faxed this last week - and it went through ok - ( I got th fax number by calling out there)   I will re-fax again today to the number provided and keep confirmation notification.

## 2020-03-26 NOTE — Telephone Encounter (Signed)
Pt daughter calling she says that pt still has not received wheelchair since July. Please re-fax to [facility] in [facility] to [phone].  Daughter asked that someone please call back when it has been done.

## 2020-03-26 NOTE — Telephone Encounter (Signed)
Fax received from [facility] from 03/26/20 stating order details are as follows for Nancy Holt, & they will follow up if any documentation required by pt's insurance that may be necessary.

## 2020-03-28 ENCOUNTER — Telehealth: Payer: Self-pay | Admitting: Nurse Practitioner

## 2020-03-28 DIAGNOSIS — I700 Atherosclerosis of aorta: Secondary | ICD-10-CM | POA: Diagnosis not present

## 2020-03-28 DIAGNOSIS — M5441 Lumbago with sciatica, right side: Secondary | ICD-10-CM | POA: Diagnosis not present

## 2020-03-28 DIAGNOSIS — M8580 Other specified disorders of bone density and structure, unspecified site: Secondary | ICD-10-CM | POA: Diagnosis not present

## 2020-03-28 DIAGNOSIS — M5136 Other intervertebral disc degeneration, lumbar region: Secondary | ICD-10-CM | POA: Diagnosis not present

## 2020-03-28 DIAGNOSIS — Z9181 History of falling: Secondary | ICD-10-CM | POA: Diagnosis not present

## 2020-03-28 DIAGNOSIS — S32040D Wedge compression fracture of fourth lumbar vertebra, subsequent encounter for fracture with routine healing: Secondary | ICD-10-CM | POA: Diagnosis not present

## 2020-03-28 NOTE — Telephone Encounter (Signed)
Pt son called  

## 2020-03-31 ENCOUNTER — Telehealth: Payer: Self-pay | Admitting: Nurse Practitioner

## 2020-03-31 NOTE — Telephone Encounter (Signed)
LMTCB

## 2020-03-31 NOTE — Telephone Encounter (Signed)
Pt son aware. 

## 2020-04-04 DIAGNOSIS — S32040D Wedge compression fracture of fourth lumbar vertebra, subsequent encounter for fracture with routine healing: Secondary | ICD-10-CM | POA: Diagnosis not present

## 2020-04-04 DIAGNOSIS — I700 Atherosclerosis of aorta: Secondary | ICD-10-CM | POA: Diagnosis not present

## 2020-04-04 DIAGNOSIS — M5136 Other intervertebral disc degeneration, lumbar region: Secondary | ICD-10-CM | POA: Diagnosis not present

## 2020-04-04 DIAGNOSIS — M8580 Other specified disorders of bone density and structure, unspecified site: Secondary | ICD-10-CM | POA: Diagnosis not present

## 2020-04-04 DIAGNOSIS — Z9181 History of falling: Secondary | ICD-10-CM | POA: Diagnosis not present

## 2020-04-04 DIAGNOSIS — M5441 Lumbago with sciatica, right side: Secondary | ICD-10-CM | POA: Diagnosis not present

## 2020-04-07 DIAGNOSIS — H401420 Capsular glaucoma with pseudoexfoliation of lens, left eye, stage unspecified: Secondary | ICD-10-CM | POA: Diagnosis not present

## 2020-04-07 DIAGNOSIS — Z9889 Other specified postprocedural states: Secondary | ICD-10-CM | POA: Diagnosis not present

## 2020-04-07 DIAGNOSIS — H40831 Aqueous misdirection, right eye: Secondary | ICD-10-CM | POA: Diagnosis not present

## 2020-04-07 NOTE — Progress Notes (Signed)
Patient location: Home Provider location: [facility] 

## 2020-04-10 DIAGNOSIS — M5441 Lumbago with sciatica, right side: Secondary | ICD-10-CM | POA: Diagnosis not present

## 2020-04-10 DIAGNOSIS — R2689 Other abnormalities of gait and mobility: Secondary | ICD-10-CM | POA: Diagnosis not present

## 2020-04-10 DIAGNOSIS — M6281 Muscle weakness (generalized): Secondary | ICD-10-CM | POA: Diagnosis not present

## 2020-04-15 DIAGNOSIS — H40831 Aqueous misdirection, right eye: Secondary | ICD-10-CM | POA: Diagnosis not present

## 2020-04-15 DIAGNOSIS — H401420 Capsular glaucoma with pseudoexfoliation of lens, left eye, stage unspecified: Secondary | ICD-10-CM | POA: Diagnosis not present

## 2020-04-15 DIAGNOSIS — H02423 Myogenic ptosis of bilateral eyelids: Secondary | ICD-10-CM | POA: Diagnosis not present

## 2020-04-15 DIAGNOSIS — Z9889 Other specified postprocedural states: Secondary | ICD-10-CM | POA: Diagnosis not present

## 2020-04-15 DIAGNOSIS — H25812 Combined forms of age-related cataract, left eye: Secondary | ICD-10-CM | POA: Diagnosis not present

## 2020-04-27 ENCOUNTER — Other Ambulatory Visit: Payer: Self-pay

## 2020-04-27 ENCOUNTER — Emergency Department (HOSPITAL_COMMUNITY)
Admission: EM | Admit: 2020-04-27 | Discharge: 2020-04-27 | Disposition: A | Discharge status: Other Acute Inpatient Hospital | Payer: Medicare Other | Attending: Emergency Medicine | Admitting: Emergency Medicine

## 2020-04-27 ENCOUNTER — Emergency Department (HOSPITAL_COMMUNITY): Payer: Medicare Other

## 2020-04-27 ENCOUNTER — Encounter (HOSPITAL_COMMUNITY): Payer: Self-pay

## 2020-04-27 DIAGNOSIS — R531 Weakness: Secondary | ICD-10-CM | POA: Diagnosis not present

## 2020-04-27 DIAGNOSIS — R1310 Dysphagia, unspecified: Secondary | ICD-10-CM | POA: Diagnosis not present

## 2020-04-27 DIAGNOSIS — Z79890 Hormone replacement therapy (postmenopausal): Secondary | ICD-10-CM | POA: Diagnosis not present

## 2020-04-27 DIAGNOSIS — R29714 NIHSS score 14: Secondary | ICD-10-CM

## 2020-04-27 DIAGNOSIS — R29898 Other symptoms and signs involving the musculoskeletal system: Secondary | ICD-10-CM | POA: Diagnosis not present

## 2020-04-27 DIAGNOSIS — I082 Rheumatic disorders of both aortic and tricuspid valves: Secondary | ICD-10-CM | POA: Diagnosis not present

## 2020-04-27 DIAGNOSIS — I639 Cerebral infarction, unspecified: Secondary | ICD-10-CM | POA: Insufficient documentation

## 2020-04-27 DIAGNOSIS — R29818 Other symptoms and signs involving the nervous system: Secondary | ICD-10-CM | POA: Diagnosis not present

## 2020-04-27 DIAGNOSIS — E876 Hypokalemia: Secondary | ICD-10-CM | POA: Diagnosis not present

## 2020-04-27 DIAGNOSIS — R4182 Altered mental status, unspecified: Secondary | ICD-10-CM | POA: Insufficient documentation

## 2020-04-27 DIAGNOSIS — E039 Hypothyroidism, unspecified: Secondary | ICD-10-CM | POA: Diagnosis not present

## 2020-04-27 DIAGNOSIS — I69354 Hemiplegia and hemiparesis following cerebral infarction affecting left non-dominant side: Secondary | ICD-10-CM | POA: Diagnosis not present

## 2020-04-27 DIAGNOSIS — R6889 Other general symptoms and signs: Secondary | ICD-10-CM | POA: Diagnosis not present

## 2020-04-27 DIAGNOSIS — I618 Other nontraumatic intracerebral hemorrhage: Secondary | ICD-10-CM | POA: Diagnosis not present

## 2020-04-27 DIAGNOSIS — Z20822 Contact with and (suspected) exposure to covid-19: Secondary | ICD-10-CM | POA: Insufficient documentation

## 2020-04-27 DIAGNOSIS — Z87891 Personal history of nicotine dependence: Secondary | ICD-10-CM | POA: Diagnosis not present

## 2020-04-27 DIAGNOSIS — R519 Headache, unspecified: Secondary | ICD-10-CM | POA: Diagnosis not present

## 2020-04-27 DIAGNOSIS — G9340 Encephalopathy, unspecified: Secondary | ICD-10-CM | POA: Diagnosis not present

## 2020-04-27 DIAGNOSIS — K219 Gastro-esophageal reflux disease without esophagitis: Secondary | ICD-10-CM | POA: Diagnosis not present

## 2020-04-27 DIAGNOSIS — I63411 Cerebral infarction due to embolism of right middle cerebral artery: Secondary | ICD-10-CM | POA: Diagnosis not present

## 2020-04-27 DIAGNOSIS — R5381 Other malaise: Secondary | ICD-10-CM | POA: Diagnosis not present

## 2020-04-27 DIAGNOSIS — L89626 Pressure-induced deep tissue damage of left heel: Secondary | ICD-10-CM | POA: Diagnosis not present

## 2020-04-27 DIAGNOSIS — Z79899 Other long term (current) drug therapy: Secondary | ICD-10-CM | POA: Insufficient documentation

## 2020-04-27 DIAGNOSIS — R404 Transient alteration of awareness: Secondary | ICD-10-CM | POA: Diagnosis not present

## 2020-04-27 DIAGNOSIS — I4891 Unspecified atrial fibrillation: Secondary | ICD-10-CM | POA: Diagnosis not present

## 2020-04-27 DIAGNOSIS — I69391 Dysphagia following cerebral infarction: Secondary | ICD-10-CM | POA: Diagnosis not present

## 2020-04-27 DIAGNOSIS — R4020 Unspecified coma: Secondary | ICD-10-CM | POA: Diagnosis not present

## 2020-04-27 DIAGNOSIS — I63311 Cerebral infarction due to thrombosis of right middle cerebral artery: Secondary | ICD-10-CM | POA: Diagnosis not present

## 2020-04-27 DIAGNOSIS — I2510 Atherosclerotic heart disease of native coronary artery without angina pectoris: Secondary | ICD-10-CM | POA: Diagnosis not present

## 2020-04-27 DIAGNOSIS — I63511 Cerebral infarction due to unspecified occlusion or stenosis of right middle cerebral artery: Secondary | ICD-10-CM | POA: Diagnosis not present

## 2020-04-27 DIAGNOSIS — J449 Chronic obstructive pulmonary disease, unspecified: Secondary | ICD-10-CM | POA: Insufficient documentation

## 2020-04-27 DIAGNOSIS — E785 Hyperlipidemia, unspecified: Secondary | ICD-10-CM | POA: Diagnosis not present

## 2020-04-27 DIAGNOSIS — I6523 Occlusion and stenosis of bilateral carotid arteries: Secondary | ICD-10-CM | POA: Diagnosis not present

## 2020-04-27 DIAGNOSIS — I10 Essential (primary) hypertension: Secondary | ICD-10-CM | POA: Insufficient documentation

## 2020-04-27 DIAGNOSIS — G8194 Hemiplegia, unspecified affecting left nondominant side: Secondary | ICD-10-CM | POA: Diagnosis not present

## 2020-04-27 DIAGNOSIS — R1312 Dysphagia, oropharyngeal phase: Secondary | ICD-10-CM | POA: Diagnosis not present

## 2020-04-27 DIAGNOSIS — R2681 Unsteadiness on feet: Secondary | ICD-10-CM | POA: Diagnosis not present

## 2020-04-27 DIAGNOSIS — R29810 Facial weakness: Secondary | ICD-10-CM | POA: Diagnosis not present

## 2020-04-27 DIAGNOSIS — Z66 Do not resuscitate: Secondary | ICD-10-CM | POA: Diagnosis not present

## 2020-04-27 DIAGNOSIS — Z515 Encounter for palliative care: Secondary | ICD-10-CM | POA: Diagnosis not present

## 2020-04-27 DIAGNOSIS — I619 Nontraumatic intracerebral hemorrhage, unspecified: Secondary | ICD-10-CM | POA: Diagnosis not present

## 2020-04-27 DIAGNOSIS — R509 Fever, unspecified: Secondary | ICD-10-CM | POA: Diagnosis not present

## 2020-04-27 DIAGNOSIS — R059 Cough, unspecified: Secondary | ICD-10-CM | POA: Diagnosis not present

## 2020-04-27 DIAGNOSIS — E871 Hypo-osmolality and hyponatremia: Secondary | ICD-10-CM | POA: Diagnosis not present

## 2020-04-27 DIAGNOSIS — I517 Cardiomegaly: Secondary | ICD-10-CM | POA: Diagnosis not present

## 2020-04-27 DIAGNOSIS — Z743 Need for continuous supervision: Secondary | ICD-10-CM | POA: Diagnosis not present

## 2020-04-27 DIAGNOSIS — G9349 Other encephalopathy: Secondary | ICD-10-CM | POA: Diagnosis not present

## 2020-04-27 DIAGNOSIS — R29709 NIHSS score 9: Secondary | ICD-10-CM | POA: Diagnosis not present

## 2020-04-27 LAB — CBC
HCT: 44 % (ref 36.0–46.0)
Hemoglobin: 15 g/dL (ref 12.0–15.0)
MCH: 28.8 pg (ref 26.0–34.0)
MCHC: 34.1 g/dL (ref 30.0–36.0)
MCV: 84.6 fL (ref 80.0–100.0)
Platelets: 318 10*3/uL (ref 150–400)
RBC: 5.2 MIL/uL — ABNORMAL HIGH (ref 3.87–5.11)
RDW: 13.5 % (ref 11.5–15.5)
WBC: 14.2 10*3/uL — ABNORMAL HIGH (ref 4.0–10.5)
nRBC: 0 % (ref 0.0–0.2)

## 2020-04-27 LAB — RESPIRATORY PANEL BY RT PCR (FLU A&B, COVID)
Influenza A by PCR: NEGATIVE
Influenza B by PCR: NEGATIVE
SARS Coronavirus 2 by RT PCR: NEGATIVE

## 2020-04-27 LAB — ETHANOL: Alcohol, Ethyl (B): <10 mg/dL (ref <10)

## 2020-04-27 LAB — DIFFERENTIAL
Abs Immature Granulocytes: 0.06 10*3/uL (ref 0.00–0.07)
Basophils Absolute: 0 10*3/uL (ref 0.0–0.1)
Basophils Relative: 0 %
Eosinophils Absolute: 0 10*3/uL (ref 0.0–0.5)
Eosinophils Relative: 0 %
Immature Granulocytes: 0 %
Lymphocytes Relative: 3 %
Lymphs Abs: 0.4 10*3/uL — ABNORMAL LOW (ref 0.7–4.0)
Monocytes Absolute: 0.7 10*3/uL (ref 0.1–1.0)
Monocytes Relative: 5 %
Neutro Abs: 13.1 10*3/uL — ABNORMAL HIGH (ref 1.7–7.7)
Neutrophils Relative %: 92 %

## 2020-04-27 LAB — COMPREHENSIVE METABOLIC PANEL
ALT: 20 U/L (ref 0–44)
AST: 23 U/L (ref 15–41)
Albumin: 4.3 g/dL (ref 3.5–5.0)
Alkaline Phosphatase: 23 U/L — ABNORMAL LOW (ref 38–126)
Anion gap: 12 (ref 5–15)
BUN: 16 mg/dL (ref 8–23)
CO2: 26 mmol/L (ref 22–32)
Calcium: 9.3 mg/dL (ref 8.9–10.3)
Chloride: 88 mmol/L — ABNORMAL LOW (ref 98–111)
Creatinine, Ser: 0.73 mg/dL (ref 0.44–1.00)
GFR, Estimated: >60 mL/min (ref >60)
Glucose, Bld: 138 mg/dL — ABNORMAL HIGH (ref 70–99)
Potassium: 2.9 mmol/L — ABNORMAL LOW (ref 3.5–5.1)
Sodium: 126 mmol/L — ABNORMAL LOW (ref 135–145)
Total Bilirubin: 1.3 mg/dL — ABNORMAL HIGH (ref 0.3–1.2)
Total Protein: 7.4 g/dL (ref 6.5–8.1)

## 2020-04-27 LAB — PROTIME-INR
INR: 1.1 (ref 0.8–1.2)
Prothrombin Time: 13.9 seconds (ref 11.4–15.2)

## 2020-04-27 LAB — MAGNESIUM: Magnesium: 1.9 mg/dL (ref 1.7–2.4)

## 2020-04-27 LAB — APTT: aPTT: 24 seconds (ref 24–36)

## 2020-04-27 LAB — CBG MONITORING, ED: Glucose-Capillary: 135 mg/dL — ABNORMAL HIGH (ref 70–99)

## 2020-04-27 MED ORDER — POTASSIUM CHLORIDE 10 MEQ/100ML IV SOLN: 10.0000 meq | INTRAVENOUS | Status: DC

## 2020-04-27 MED ORDER — IOHEXOL 350 MG/ML SOLN
75.0000 mL | Freq: Once | INTRAVENOUS | Status: AC | PRN
  Administered 2020-04-27: 75 mL via INTRAVENOUS

## 2020-04-27 MED ORDER — IOHEXOL 350 MG/ML SOLN
40.0000 mL | Freq: Once | INTRAVENOUS | Status: AC | PRN
  Administered 2020-04-27: 40 mL via INTRAVENOUS

## 2020-04-27 NOTE — ED Provider Notes (Signed)
Medical screening examination/treatment/procedure(s) were conducted as a shared visit with non-physician practitioner(s) and myself.  I personally evaluated the patient during the encounter.      Patient seen by me along with physician assistant.  Patient last seen normal at about 7 PM last night.  Patient lives by herself.  Very independent.  Patient presenting here today with significant left upper extremity weakness appears to have some visual problems.  Some left lower extremity weakness.  Patient not a candidate for TPA but code stroke was activated.  And underwent CT head as well as CT angio head and neck and perfusion.  The studies were consistent with a right MCA territory infarct.  We contacted neuro hospitalist at [facility] but Dr. Laurence Compton interventional person was tight up for the next 3 to 4 hours.  So the teleneurologist contacted [facility] spoke to Dr. Lanna Poche from interventional there and he has excepted her.  We have made arrangements for the patient to go to the emergency department.  Patient will be transferred to the emergency department at [facility].  They have determined patient is a candidate for interventional.   CRITICAL CARE Performed by: Vanetta Mulders Total critical care time: 60 minutes Critical care time was exclusive of separately billable procedures and treating other patients. Critical care was necessary to treat or prevent imminent or life-threatening deterioration. Critical care was time spent personally by me on the following activities: development of treatment plan with patient and/or surrogate as well as nursing, discussions with consultants, evaluation of patient's response to treatment, examination of patient, obtaining history from patient or surrogate, ordering and performing treatments and interventions, ordering and review of laboratory studies, ordering and review of radiographic studies, pulse oximetry and re-evaluation of patient's  condition.   Results for orders placed or performed during the hospital encounter of 04/27/20  Respiratory Panel by RT PCR (Flu A&B, Covid) - Nasopharyngeal Swab   Specimen: Nasopharyngeal Swab  Result Value Ref Range   SARS Coronavirus 2 by RT PCR NEGATIVE NEGATIVE   Influenza A by PCR NEGATIVE NEGATIVE   Influenza B by PCR NEGATIVE NEGATIVE  Ethanol  Result Value Ref Range   Alcohol, Ethyl (B) <10 <10 mg/dL  Protime-INR  Result Value Ref Range   Prothrombin Time 13.9 11.4 - 15.2 seconds   INR 1.1 0.8 - 1.2  APTT  Result Value Ref Range   aPTT 24 24 - 36 seconds  CBC  Result Value Ref Range   WBC 14.2 (H) 4.0 - 10.5 K/uL   RBC 5.20 (H) 3.87 - 5.11 MIL/uL   Hemoglobin 15.0 12.0 - 15.0 g/dL   HCT 98.9 36 - 46 %   MCV 84.6 80.0 - 100.0 fL   MCH 28.8 26.0 - 34.0 pg   MCHC 34.1 30.0 - 36.0 g/dL   RDW 21.1 94.1 - 74.0 %   Platelets 318 150 - 400 K/uL   nRBC 0.0 0.0 - 0.2 %  Differential  Result Value Ref Range   Neutrophils Relative % 92 %   Neutro Abs 13.1 (H) 1.7 - 7.7 K/uL   Lymphocytes Relative 3 %   Lymphs Abs 0.4 (L) 0.7 - 4.0 K/uL   Monocytes Relative 5 %   Monocytes Absolute 0.7 0.1 - 1.0 K/uL   Eosinophils Relative 0 %   Eosinophils Absolute 0.0 0 - 0 K/uL   Basophils Relative 0 %   Basophils Absolute 0.0 0 - 0 K/uL   Immature Granulocytes 0 %   Abs Immature Granulocytes  0.06 0.00 - 0.07 K/uL  Comprehensive metabolic panel  Result Value Ref Range   Sodium 126 (L) 135 - 145 mmol/L   Potassium 2.9 (L) 3.5 - 5.1 mmol/L   Chloride 88 (L) 98 - 111 mmol/L   CO2 26 22 - 32 mmol/L   Glucose, Bld 138 (H) 70 - 99 mg/dL   BUN 16 8 - 23 mg/dL   Creatinine, Ser 6.64 0.44 - 1.00 mg/dL   Calcium 9.3 8.9 - 40.3 mg/dL   Total Protein 7.4 6.5 - 8.1 g/dL   Albumin 4.3 3.5 - 5.0 g/dL   AST 23 15 - 41 U/L   ALT 20 0 - 44 U/L   Alkaline Phosphatase 23 (L) 38 - 126 U/L   Total Bilirubin 1.3 (H) 0.3 - 1.2 mg/dL   GFR, Estimated >47 >42 mL/min   Anion gap 12 5 - 15  CBG  monitoring, ED  Result Value Ref Range   Glucose-Capillary 135 (H) 70 - 99 mg/dL   CT Angio Head W or Wo Contrast  Result Date: 04/27/2020 CLINICAL DATA:  Left-sided facial droop. Last seen normal 7 p.m. last night. EXAM: CT ANGIOGRAPHY HEAD AND NECK TECHNIQUE: Multidetector CT imaging of the head and neck was performed using the standard protocol during bolus administration of intravenous contrast. Multiplanar CT image reconstructions and MIPs were obtained to evaluate the vascular anatomy. Carotid stenosis measurements (when applicable) are obtained utilizing NASCET criteria, using the distal internal carotid diameter as the denominator. CONTRAST:  63mL OMNIPAQUE IOHEXOL 350 MG/ML SOLN COMPARISON:  CT head without contrast 04/27/2020 FINDINGS: CTA NECK FINDINGS Aortic arch: The aortic arch is incompletely imaged. Atherosclerotic changes are present at the arch and great vessel origins without significant stenosis or aneurysm. Right carotid system: The right common carotid artery is within normal limits. Atherosclerotic changes are present at the right carotid bifurcation without a significant stenosis relative to the more distal vessel. Mild tortuosity is present in the mid cervical right ICA without significant stenosis. Left carotid system: The left common carotid artery is within normal limits. Atherosclerotic changes are present bifurcation without significant stenosis. Cervical left ICA is normal. Vertebral arteries: The vertebral arteries are codominant scratched at the left vertebral artery is slightly dominant to the right. Calcifications are present at the origin of the vertebral arteries bilaterally without significant stenoses. No significant stenoses are present in either vertebral artery in the neck. Skeleton: Advanced degenerative changes are present cervical spine. Degenerative anterolisthesis present C3-4. Facet hypertrophy is worse on the left at C3-4 and C4-5. No focal lytic or blastic  lesions are present. Other neck: Thyroid is somewhat heterogeneous without a dominant lesion. It is not enlarged. Salivary glands are within normal limits. No significant adenopathy is present. No mass lesion is present. Upper chest: A small right pleural effusion is present with associated atelectasis. Edema is present. Esophagus is dilated with a fluid level. No mass lesion is present. Review of the MIP images confirms the above findings CTA HEAD FINDINGS Anterior circulation: Supraclinoid atherosclerotic changes are present bilaterally without significant stenosis through the ICA termini. The A1 segments and left M1 segment are normal. The right MCA is occluded just after a an early inferior MCA branch. M2 branch vessels are reconstituted just beyond the expected bifurcation with excellent collaterals. Reconstitution is more robust posteriorly. There is some paucity of vessels anteriorly. ACA branch vessels and left MCA branch vessels are within limits. Posterior circulation: The vertebral arteries are codominant. PICA origins are within normal limits.  There is some narrowing of the distal right V4 segment. Mild narrowing of less than 50% is present throughout the basilar artery with areas of dense calcification. Both posterior cerebral arteries originate from the basilar tip. Moderate narrowing is present in the right P2 segment. Mild irregularity is present in the left P2 segment without focal stenosis. Scattered narrowing of distal PCA branch vessels is present bilaterally. Venous sinuses: The dural sinuses are patent. The straight sinus deep cerebral veins are intact. Cortical veins are unremarkable. Anatomic variants: None Review of the MIP images confirms the above findings IMPRESSION: 1. Emergent large vessel occlusion of the right MCA just after an early inferior branch vessel. This corresponds with the right basal ganglia infarcts associated with the lenticulostriate vessels. 2. M2 branch vessels are  reconstituted just beyond the expected bifurcation with excellent collaterals. 3. Moderate narrowing of the right P2 segment. 4. Mild narrowing of the distal right V4 segment. 5. Mild narrowing of the basilar artery. 6. Scattered distal small vessel disease. 7. Atherosclerotic changes at the carotid bifurcations bilaterally without significant stenosis relative to the more distal vessels. 8. Advanced degenerative changes in the cervical spine. 9. Small right pleural effusion with associated atelectasis. 10. Aortic Atherosclerosis (ICD10-I70.0). These results were called by telephone at the time of interpretation on 04/27/2020 at 2: 03 pm to provider Drs. JOLDERSMA and BHAGAT, who verbally acknowledged these results. Electronically Signed   By: Marin Roberts M.D.   On: 04/27/2020 14:14   CT Angio Neck W and/or Wo Contrast  Result Date: 04/27/2020 CLINICAL DATA:  Left-sided facial droop. Last seen normal 7 p.m. last night. EXAM: CT ANGIOGRAPHY HEAD AND NECK TECHNIQUE: Multidetector CT imaging of the head and neck was performed using the standard protocol during bolus administration of intravenous contrast. Multiplanar CT image reconstructions and MIPs were obtained to evaluate the vascular anatomy. Carotid stenosis measurements (when applicable) are obtained utilizing NASCET criteria, using the distal internal carotid diameter as the denominator. CONTRAST:  63mL OMNIPAQUE IOHEXOL 350 MG/ML SOLN COMPARISON:  CT head without contrast 04/27/2020 FINDINGS: CTA NECK FINDINGS Aortic arch: The aortic arch is incompletely imaged. Atherosclerotic changes are present at the arch and great vessel origins without significant stenosis or aneurysm. Right carotid system: The right common carotid artery is within normal limits. Atherosclerotic changes are present at the right carotid bifurcation without a significant stenosis relative to the more distal vessel. Mild tortuosity is present in the mid cervical right ICA  without significant stenosis. Left carotid system: The left common carotid artery is within normal limits. Atherosclerotic changes are present bifurcation without significant stenosis. Cervical left ICA is normal. Vertebral arteries: The vertebral arteries are codominant scratched at the left vertebral artery is slightly dominant to the right. Calcifications are present at the origin of the vertebral arteries bilaterally without significant stenoses. No significant stenoses are present in either vertebral artery in the neck. Skeleton: Advanced degenerative changes are present cervical spine. Degenerative anterolisthesis present C3-4. Facet hypertrophy is worse on the left at C3-4 and C4-5. No focal lytic or blastic lesions are present. Other neck: Thyroid is somewhat heterogeneous without a dominant lesion. It is not enlarged. Salivary glands are within normal limits. No significant adenopathy is present. No mass lesion is present. Upper chest: A small right pleural effusion is present with associated atelectasis. Edema is present. Esophagus is dilated with a fluid level. No mass lesion is present. Review of the MIP images confirms the above findings CTA HEAD FINDINGS Anterior circulation: Supraclinoid atherosclerotic changes  are present bilaterally without significant stenosis through the ICA termini. The A1 segments and left M1 segment are normal. The right MCA is occluded just after a an early inferior MCA branch. M2 branch vessels are reconstituted just beyond the expected bifurcation with excellent collaterals. Reconstitution is more robust posteriorly. There is some paucity of vessels anteriorly. ACA branch vessels and left MCA branch vessels are within limits. Posterior circulation: The vertebral arteries are codominant. PICA origins are within normal limits. There is some narrowing of the distal right V4 segment. Mild narrowing of less than 50% is present throughout the basilar artery with areas of dense  calcification. Both posterior cerebral arteries originate from the basilar tip. Moderate narrowing is present in the right P2 segment. Mild irregularity is present in the left P2 segment without focal stenosis. Scattered narrowing of distal PCA branch vessels is present bilaterally. Venous sinuses: The dural sinuses are patent. The straight sinus deep cerebral veins are intact. Cortical veins are unremarkable. Anatomic variants: None Review of the MIP images confirms the above findings IMPRESSION: 1. Emergent large vessel occlusion of the right MCA just after an early inferior branch vessel. This corresponds with the right basal ganglia infarcts associated with the lenticulostriate vessels. 2. M2 branch vessels are reconstituted just beyond the expected bifurcation with excellent collaterals. 3. Moderate narrowing of the right P2 segment. 4. Mild narrowing of the distal right V4 segment. 5. Mild narrowing of the basilar artery. 6. Scattered distal small vessel disease. 7. Atherosclerotic changes at the carotid bifurcations bilaterally without significant stenosis relative to the more distal vessels. 8. Advanced degenerative changes in the cervical spine. 9. Small right pleural effusion with associated atelectasis. 10. Aortic Atherosclerosis (ICD10-I70.0). These results were called by telephone at the time of interpretation on 04/27/2020 at 2: 03 pm to provider Drs. JOLDERSMA and BHAGAT, who verbally acknowledged these results. Electronically Signed   By: Marin Robertshristopher  Mattern M.D.   On: 04/27/2020 14:14   CT CEREBRAL PERFUSION W CONTRAST  Result Date: 04/27/2020 CLINICAL DATA:  Right M1 occlusion.  Left-sided weakness. EXAM: CT PERFUSION BRAIN TECHNIQUE: Multiphase CT imaging of the brain was performed following IV bolus contrast injection. Subsequent parametric perfusion maps were calculated using RAPID software. CONTRAST:  40mL OMNIPAQUE IOHEXOL 350 MG/ML SOLN COMPARISON:  CT head without contrast and CTA head  and neck 04/27/2020. FINDINGS: CT Brain Perfusion Findings: CBF (<30%) Volume: 0mL Perfusion (Tmax>6.0s) volume: 126mL Mismatch Volume: 126mL ASPECTS on noncontrast CT Head: 8/10 at 1:15 p.m. today. Using a higher threshold for CBF, core infarct measures up to 10 mm in the right basal ganglia, corresponding to the noncontrast CT. Infarction Location:Right MCA territory IMPRESSION: 1. Right MCA territory core infarct measuring up to 10 mm. This corresponds with the noncontrast CT head. 2. Surrounding right MCA territory penumbra measures 126 mL. Electronically Signed   By: Marin Robertshristopher  Mattern M.D.   On: 04/27/2020 14:44   CT HEAD CODE STROKE WO CONTRAST  Result Date: 04/27/2020 CLINICAL DATA:  Code stroke. Left-sided facial droop and weakness. Last seen normal at 7 p.m. last night. EXAM: CT HEAD WITHOUT CONTRAST TECHNIQUE: Contiguous axial images were obtained from the base of the skull through the vertex without intravenous contrast. COMPARISON:  None. FINDINGS: Brain: Hypoattenuation is noted at the right caudate head and lentiform nucleus. Insular ribbon is intact. No other focal cortical abnormalities are present. Mild generalized atrophy and white matter disease is present. Left basal ganglia are within limits. Thalami are normal. There is some heterogeneity in the  pons which may be artifact. Additional ischemic changes are not excluded. The cerebellum is within normal limits. Vascular: Atherosclerotic calcifications are present within the cavernous internal carotid arteries bilaterally. Distal calcifications are present at the dural margin of both vertebral arteries and within the basilar artery. No hyperdense vessel is present. Skull: Calvarium is intact. No focal lytic or blastic lesions are present. No significant extracranial soft tissue lesion is present. Sinuses/Orbits: A fluid level is present in the left maxillary sinus. Mild mucosal thickening is present in the inferior right maxillary sinus. The  paranasal sinuses and mastoid air cells are otherwise clear. Right lens replacement is present. Globes and orbits are otherwise within normal limits. ASPECTS [facility] Stroke Program Early CT Score) - Ganglionic level infarction (caudate, lentiform nuclei, internal capsule, insula, M1-M3 cortex): 5/7 - Supraganglionic infarction (M4-M6 cortex): 3/3 Total score (0-10 with 10 being normal): 8/10 IMPRESSION: 1. Hypoattenuation at the right caudate head and lentiform nucleus is concerning for acute/subacute infarct. 2. Heterogeneity in the pons may be artifact. Additional ischemic changes are not excluded. 3. ASPECTS is 8/10. 4. Mild generalized atrophy and white matter disease likely reflects the sequela of chronic microvascular ischemia. 5. Atherosclerosis. These results were called by telephone at the time of interpretation on 04/27/2020 at 1:30 pm to provider [facility] , who verbally acknowledged these results. Electronically Signed   By: Marin Roberts M.D.   On: 04/27/2020 13:31      Vanetta Mulders, MD 04/27/20 [phone]

## 2020-04-27 NOTE — Consult Note (Addendum)
[facility] Telemedicine Consult  Requesting Provider: Vanetta Holt   Chief Complaint: Left sided weakness   HPI: 86 year old woman with a PMHx of HLD, HTN, CAD, COPD, OSA,hypothyroidism,  found with L facial droop, L hemiparesis  At baseline she is completely independent, though since an L4 fracture in July 2021 she has walked with a walker and family has arranged to always have someone bring by lunch and dinner. She manages her own finances and medications.   She was last seen well at 7 PM. Family notes that they did find some urine in her bedside commode (as she pees typically every 2 hours and the bathroom is too far to walk regularly at night since her fracture). There was urine in the portapotty and she had soiled her mattress.   8 days ago was confused (Saturday night didn't realize the next day was Sunday and was generally talking quietly). The rest of the week since she's been fine per family. Nancy Holt has also had some declining vision in her right eye for which surgery was deferred by ophthalmology due to her age. No other recent issues per family.   Initial BP 168/96  EMS BG 126  DNR paperwork, but family Nancy Holt and Nancy Holt) in agreement (as is patient, in discussion with Nancy Holt) that this should be reversed for this procedure. Should the procedure not go well, patient's wishes would be to transition to comfort care per family   LKW: 7 PM tpa given?: No, out of the window  IR Thrombectomy? No,  Modified Rankin Scale: 0-Completely asymptomatic and back to baseline post- stroke  Past Medical History:  Diagnosis Date  . Allergy   . Arthritis   . COPD (chronic obstructive pulmonary disease) (HCC)   . Diverticulosis   . GERD (gastroesophageal reflux disease)   . Hyperlipidemia   . Hypertension   . Thyroid disease      Exam: Vitals:   04/27/20 1305  BP: (!) 173/96  Pulse: 82  SpO2: 96%    General: Eyes closed, reports room is too  bright MS: Able to follow simple commands, oriented to person, age, month and year  CN:  Clear right gaze preference. Able to cross midline to the left, left facial droop, sensation intact, tongue midline,  Motor: Diffusely weak however clearly neglecting the left side. Able to move left arm better when it is held up for her. Faster drift of the left arm than the right though both hit the bed. Both legs are weak likely secondary to L4 fracture Sensation: No extinction to double simultaneous stimulation   1A: Level of Consciousness - 1 1B: Ask Month and Age - 0 1C: 'Blink Eyes' & 'Squeeze Hands' - 0 2: Test Horizontal Extraocular Movements - 1 3: Test Visual Fields - 0 per nurse though I suspect some neglect if not field cut based on telexam 4: Test Facial Palsy - 2  5A: Test Left Arm Motor Drift - 2 5B: Test Right Arm Motor Drift - 1 6A: Test Left Leg Motor Drift - 2 6B: Test Right Leg Motor Drift - 2 7: Test Limb Ataxia - 0 8: Test Sensation - 0 9: Test Language/Aphasia- 1 10: Test Dysarthria - 1 11: Test Extinction/Inattention - 1 NIHSS score: 14  Imaging Personally Reviewed:  HCT Aspects 10 with some right basal ganglia changes  Radiology read: 1. Hypoattenuation at the right caudate head and lentiform nucleus is concerning for acute/subacute infarct. 2. Heterogeneity in the pons may be  artifact. Additional ischemic changes are not excluded. 3. ASPECTS is 8/10. 4. Mild generalized atrophy and white matter disease likely reflects the sequela of chronic microvascular ischemia. 5. Atherosclerosis.  CTA Proximal R M1 occlusion Radiology read: 1. Emergent large vessel occlusion of the right MCA just after an early inferior branch vessel. This corresponds with the right basal ganglia infarcts associated with the lenticulostriate vessels. 2. M2 branch vessels are reconstituted just beyond the expected bifurcation with excellent collaterals. 3. Moderate narrowing of the right P2  segment. 4. Mild narrowing of the distal right V4 segment. 5. Mild narrowing of the basilar artery. 6. Scattered distal small vessel disease. 7. Atherosclerotic changes at the carotid bifurcations bilaterally without significant stenosis relative to the more distal vessels. 8. Advanced degenerative changes in the cervical spine. 9. Small right pleural effusion with associated atelectasis. 10. Aortic Atherosclerosis (ICD10-I70.0).  CTP with 126 mL area at risk and no core   Labs reviewed in epic and pertinent values follow:  Ref Range & Units 14:08  SARS Coronavirus 2 by RT PCR NEGATIVE NEGATIVE   Comment: (NOTE)  SARS-CoV-2 target nucleic acids are NOT DETECTED.        Last metabolic panel Lab Results  Component Value Date   GLUCOSE 138 (H) 04/27/2020   NA 126 (L) 04/27/2020   K 2.9 (L) 04/27/2020   CL 88 (L) 04/27/2020   CO2 26 04/27/2020   BUN 16 04/27/2020   CREATININE 0.73 04/27/2020   GFRNONAA >60 04/27/2020   GFRAA 55 (L) 03/25/2020   CALCIUM 9.3 04/27/2020   PROT 7.4 04/27/2020   ALBUMIN 4.3 04/27/2020   LABGLOB 2.3 03/25/2020   AGRATIO 1.7 03/25/2020   BILITOT 1.3 (H) 04/27/2020   ALKPHOS 23 (L) 04/27/2020   AST 23 04/27/2020   ALT 20 04/27/2020   ANIONGAP 12 04/27/2020    Lab Results  Component Value Date   CHOL 170 03/25/2020   HDL 54 03/25/2020   LDLCALC 101 (H) 03/25/2020   TRIG 78 03/25/2020   CHOLHDL 3.1 03/25/2020   Assessment: 86 year old w/ proximal R M1 occlusion w/ favorable thrombectomy profile, out of the tPA window. Unable to perform at [facility] secondary to simultaneous case in thrombectomy. Mechanism atheroembolic vs. cardioembolic  Recommendations:  - Emergent thrombectomy - Dr. Lanna Poche IR at [facility] agreeable to proceed  - Dr. Rennis Petty at [facility] accepting  - Attention to chronic hyponatremia signed out to Dr. Lanna Poche, as well as Donia Pounds   Consult Participants: [facility] nurse Nancy Holt,  Location of the provider: [facility]   Location of the patient: Nancy Holt  This consult was provided via telemedicine with 2-way video and audio communication. The patient/family was informed that care would be provided in this way and agreed to receive care in this manner.   This patient is receiving care for possible acute neurological changes. There was 120 minutes of critical care by this provider at the time of service, including time for direct evaluation via telemedicine, review of medical records, imaging studies and discussion of findings with providers, the patient and/or family.  Brooke Dare MD-PhD [facility] [phone]   If 7pm- 7am, please page neurology on call as listed in AMION.

## 2020-04-27 NOTE — ED Notes (Signed)
teleneurologist discussing risks and benefits of intervention.

## 2020-04-27 NOTE — ED Triage Notes (Signed)
EMS reports was called out for unresponsive pt.  Pt was laying in bed verbal, but won't open eyes.  EMS says pt usually very independent and ambulatory.  LKW 7pm yesterday.  Reports was diaphoretic in bed and incontinent.  CBG 126.  Reports left sided facial droop.  Moving r side "some" ems started 20g in r forearm.

## 2020-04-27 NOTE — Progress Notes (Signed)
Code Stroke Time Documentation   1310 Call Time 1309 Beeper Time 1314 Exam Started  1322 Exam Finished 1322 Images sent to [facility] 1322 Exam completed in Epic  [id number] [facility] radiology called

## 2020-04-27 NOTE — ED Notes (Signed)
Code Stroke paged out 

## 2020-04-27 NOTE — ED Provider Notes (Signed)
[facility] EMERGENCY DEPARTMENT Provider Note   CSN: [id number] Arrival date & time: 04/27/20  1303     History No chief complaint on file.   Nancy Holt is a 86 y.o. female.  HPI Patient is a 86 year old female with history of COPD, diverticulosis, GERD, hyperlipidemia, hypertension, who presents to the emergency department due to altered mental status.  EMS was contacted because patient was unresponsive this morning.  Last seen normal yesterday evening at 7 PM.  Patient lives alone and is independent.  Patient found this morning and would not open eyes, had obvious left-sided facial droop, was diaphoretic, and was lying in bed incontinent.  EMS was contacted and patient was transported to the emergency department.  Level 5 caveat secondary to acuity of condition     Past Medical History:  Diagnosis Date  . Allergy   . Arthritis   . COPD (chronic obstructive pulmonary disease) (HCC)   . Diverticulosis   . GERD (gastroesophageal reflux disease)   . Hyperlipidemia   . Hypertension   . Thyroid disease     Patient Active Problem List   Diagnosis Date Noted  . OSA on CPAP 08/19/2015  . BMI 29.0-29.9,adult 04/25/2015  . CAD (coronary artery disease), native coronary artery 01/21/2015  . Hyperlipemia 11/23/2010  . HTN (hypertension) 11/23/2010  . Hypothyroidism 11/23/2010  . GERD (gastroesophageal reflux disease) 11/23/2010  . COPD (chronic obstructive pulmonary disease) (HCC) 11/23/2010  . Diverticulosis 11/23/2010  . Venous (peripheral) insufficiency 11/23/2010    History reviewed. No pertinent surgical history.   OB History   No obstetric history on file.     Family History  Problem Relation Age of Onset  . Aneurysm Father     Social History   Tobacco Use  . Smoking status: Former Smoker    Quit date: 12/13/1968    Years since quitting: 51.4  . Smokeless tobacco: Never Used  Vaping Use  . Vaping Use: Never used  Substance Use Topics  . Alcohol  use: No  . Drug use: Never    Home Medications Prior to Admission medications   Medication Sig Start Date End Date Taking? Authorizing Provider  Cholecalciferol (VITAMIN D) 2000 UNITS CAPS Take by mouth. One daily      [provider]  diltiazem (CARDIZEM CD) 240 MG 24 hr capsule Take 1 capsule (240 mg total) by mouth daily. 03/25/20   Daphine Deutscher, Mary-Margaret, FNP  Flaxseed, Linseed, (FLAX SEED OIL) 1000 MG CAPS Take by mouth. 3 daily       [provider]  hydrochlorothiazide (HYDRODIURIL) 25 MG tablet Take 1 tablet (25 mg total) by mouth daily. 03/25/20   Daphine Deutscher Mary-Margaret, FNP  levothyroxine (SYNTHROID) 25 MCG tablet Take 1 tablet (25 mcg total) by mouth every morning. 03/25/20   Daphine Deutscher, Mary-Margaret, FNP  losartan (COZAAR) 100 MG tablet Take 1 tablet (100 mg total) by mouth daily. 03/25/20   Daphine Deutscher Mary-Margaret, FNP  traMADol (ULTRAM) 50 MG tablet Take 1 tablet (50 mg total) by mouth 2 (two) times daily as needed. 01/29/20   Gwenlyn Fudge, FNP    Allergies    Lipitor [atorvastatin calcium] and Lisinopril  Review of Systems   Review of Systems  Unable to perform ROS: Acuity of condition   Physical Exam Updated Vital Signs BP (!) 173/96 (BP Location: Left Arm)   Pulse 82   Wt 72.6 kg   SpO2 96%   BMI 26.63 kg/m   Physical Exam Vitals and nursing note reviewed.  Constitutional:      General: She is in acute distress.     Appearance: She is normal weight. She is ill-appearing. She is not toxic-appearing or diaphoretic.     Comments: Patient lying supine and not clearly answering questions.  Questions have to be repeated multiple times.  Patient will follow commands when asked.  Obvious urinary incontinence.  Patient keeps eyes closed and does not appear to be able to open them.  HENT:     Head: Normocephalic and atraumatic.     Right Ear: External ear normal.     Left Ear: External ear normal.     Nose: Nose normal.     Mouth/Throat:     Pharynx:  Oropharynx is clear.     Comments: Obvious left-sided facial droop when patient smiles. Eyes:     Comments: Patient would not open eyes when asked.  Right pupil examined and pupil is dilated, minimally responsive to light, and the right eye is fixed in abduction.  Left pupil mildly constricted and mildly reactive to light.  Cardiovascular:     Rate and Rhythm: Normal rate and regular rhythm.     Pulses: Normal pulses.     Heart sounds: Normal heart sounds. No murmur heard.  No friction rub. No gallop.   Pulmonary:     Effort: Pulmonary effort is normal. No respiratory distress.     Breath sounds: Normal breath sounds. No stridor. No wheezing, rhonchi or rales.  Abdominal:     General: Abdomen is flat.     Palpations: Abdomen is soft.     Tenderness: There is no abdominal tenderness.     Comments: Abdomen soft and does not exhibit any signs of pain with deep palpation of all 4 quadrants.  Musculoskeletal:        General: Normal range of motion.     Cervical back: Normal range of motion and neck supple. No tenderness.  Skin:    General: Skin is warm and dry.  Neurological:     Comments: Difficult to assess extremities secondary to patient compliance.  Seems to be weakness involving the left upper extremity and left lower extremity.  Grip strength moderately diminished in the left hand when compared to the right.  Patient able to plantar flex bilateral feet but there appears to be diminished strength with plantar flexion in the left foot.  Patient is oriented to self.    ED Results / Procedures / Treatments   Labs (all labs ordered are listed, but only abnormal results are displayed) Labs Reviewed  CBC - Abnormal; Notable for the following components:      Result Value   WBC 14.2 (*)    RBC 5.20 (*)    All other components within normal limits  DIFFERENTIAL - Abnormal; Notable for the following components:   Neutro Abs 13.1 (*)    Lymphs Abs 0.4 (*)    All other components within  normal limits  COMPREHENSIVE METABOLIC PANEL - Abnormal; Notable for the following components:   Sodium 126 (*)    Potassium 2.9 (*)    Chloride 88 (*)    Glucose, Bld 138 (*)    Alkaline Phosphatase 23 (*)    Total Bilirubin 1.3 (*)    All other components within normal limits  CBG MONITORING, ED - Abnormal; Notable for the following components:   Glucose-Capillary 135 (*)    All other components within normal limits  RESPIRATORY PANEL BY RT PCR (FLU A&B, COVID)  ETHANOL  PROTIME-INR  APTT  URINALYSIS, ROUTINE W REFLEX MICROSCOPIC  RAPID URINE DRUG SCREEN, HOSP PERFORMED  MAGNESIUM  I-STAT CHEM 8, ED   EKG EKG Interpretation  Date/Time:  Sunday April 27 2020 14:54:26 EDT Ventricular Rate:  89 PR Interval:    QRS Duration: 114 QT Interval:  425 QTC Calculation: 515 R Axis:   85 Text Interpretation: Sinus rhythm Atrial premature complex Short PR interval Probable left atrial enlargement Borderline intraventricular conduction delay RSR' in V1 or V2, probably normal variant Repol abnrm suggests ischemia, diffuse leads Prolonged QT interval Interpretation limited secondary to artifact Confirmed by Vanetta Mulders [phone]) on 04/27/2020 3:24:18 PM   Radiology CT Angio Head W or Wo Contrast  Result Date: 04/27/2020 CLINICAL DATA:  Left-sided facial droop. Last seen normal 7 p.m. last night. EXAM: CT ANGIOGRAPHY HEAD AND NECK TECHNIQUE: Multidetector CT imaging of the head and neck was performed using the standard protocol during bolus administration of intravenous contrast. Multiplanar CT image reconstructions and MIPs were obtained to evaluate the vascular anatomy. Carotid stenosis measurements (when applicable) are obtained utilizing NASCET criteria, using the distal internal carotid diameter as the denominator. CONTRAST:  31mL OMNIPAQUE IOHEXOL 350 MG/ML SOLN COMPARISON:  CT head without contrast 04/27/2020 FINDINGS: CTA NECK FINDINGS Aortic arch: The aortic arch is incompletely  imaged. Atherosclerotic changes are present at the arch and great vessel origins without significant stenosis or aneurysm. Right carotid system: The right common carotid artery is within normal limits. Atherosclerotic changes are present at the right carotid bifurcation without a significant stenosis relative to the more distal vessel. Mild tortuosity is present in the mid cervical right ICA without significant stenosis. Left carotid system: The left common carotid artery is within normal limits. Atherosclerotic changes are present bifurcation without significant stenosis. Cervical left ICA is normal. Vertebral arteries: The vertebral arteries are codominant scratched at the left vertebral artery is slightly dominant to the right. Calcifications are present at the origin of the vertebral arteries bilaterally without significant stenoses. No significant stenoses are present in either vertebral artery in the neck. Skeleton: Advanced degenerative changes are present cervical spine. Degenerative anterolisthesis present C3-4. Facet hypertrophy is worse on the left at C3-4 and C4-5. No focal lytic or blastic lesions are present. Other neck: Thyroid is somewhat heterogeneous without a dominant lesion. It is not enlarged. Salivary glands are within normal limits. No significant adenopathy is present. No mass lesion is present. Upper chest: A small right pleural effusion is present with associated atelectasis. Edema is present. Esophagus is dilated with a fluid level. No mass lesion is present. Review of the MIP images confirms the above findings CTA HEAD FINDINGS Anterior circulation: Supraclinoid atherosclerotic changes are present bilaterally without significant stenosis through the ICA termini. The A1 segments and left M1 segment are normal. The right MCA is occluded just after a an early inferior MCA branch. M2 branch vessels are reconstituted just beyond the expected bifurcation with excellent collaterals.  Reconstitution is more robust posteriorly. There is some paucity of vessels anteriorly. ACA branch vessels and left MCA branch vessels are within limits. Posterior circulation: The vertebral arteries are codominant. PICA origins are within normal limits. There is some narrowing of the distal right V4 segment. Mild narrowing of less than 50% is present throughout the basilar artery with areas of dense calcification. Both posterior cerebral arteries originate from the basilar tip. Moderate narrowing is present in the right P2 segment. Mild irregularity is present in the left P2 segment without focal stenosis. Scattered narrowing  of distal PCA branch vessels is present bilaterally. Venous sinuses: The dural sinuses are patent. The straight sinus deep cerebral veins are intact. Cortical veins are unremarkable. Anatomic variants: None Review of the MIP images confirms the above findings IMPRESSION: 1. Emergent large vessel occlusion of the right MCA just after an early inferior branch vessel. This corresponds with the right basal ganglia infarcts associated with the lenticulostriate vessels. 2. M2 branch vessels are reconstituted just beyond the expected bifurcation with excellent collaterals. 3. Moderate narrowing of the right P2 segment. 4. Mild narrowing of the distal right V4 segment. 5. Mild narrowing of the basilar artery. 6. Scattered distal small vessel disease. 7. Atherosclerotic changes at the carotid bifurcations bilaterally without significant stenosis relative to the more distal vessels. 8. Advanced degenerative changes in the cervical spine. 9. Small right pleural effusion with associated atelectasis. 10. Aortic Atherosclerosis (ICD10-I70.0). These results were called by telephone at the time of interpretation on 04/27/2020 at 2: 03 pm to provider Drs. Laretta Pyatt and BHAGAT, who verbally acknowledged these results. Electronically Signed   By: Marin Roberts M.D.   On: 04/27/2020 14:14   CT Angio Neck  W and/or Wo Contrast  Result Date: 04/27/2020 CLINICAL DATA:  Left-sided facial droop. Last seen normal 7 p.m. last night. EXAM: CT ANGIOGRAPHY HEAD AND NECK TECHNIQUE: Multidetector CT imaging of the head and neck was performed using the standard protocol during bolus administration of intravenous contrast. Multiplanar CT image reconstructions and MIPs were obtained to evaluate the vascular anatomy. Carotid stenosis measurements (when applicable) are obtained utilizing NASCET criteria, using the distal internal carotid diameter as the denominator. CONTRAST:  75mL OMNIPAQUE IOHEXOL 350 MG/ML SOLN COMPARISON:  CT head without contrast 04/27/2020 FINDINGS: CTA NECK FINDINGS Aortic arch: The aortic arch is incompletely imaged. Atherosclerotic changes are present at the arch and great vessel origins without significant stenosis or aneurysm. Right carotid system: The right common carotid artery is within normal limits. Atherosclerotic changes are present at the right carotid bifurcation without a significant stenosis relative to the more distal vessel. Mild tortuosity is present in the mid cervical right ICA without significant stenosis. Left carotid system: The left common carotid artery is within normal limits. Atherosclerotic changes are present bifurcation without significant stenosis. Cervical left ICA is normal. Vertebral arteries: The vertebral arteries are codominant scratched at the left vertebral artery is slightly dominant to the right. Calcifications are present at the origin of the vertebral arteries bilaterally without significant stenoses. No significant stenoses are present in either vertebral artery in the neck. Skeleton: Advanced degenerative changes are present cervical spine. Degenerative anterolisthesis present C3-4. Facet hypertrophy is worse on the left at C3-4 and C4-5. No focal lytic or blastic lesions are present. Other neck: Thyroid is somewhat heterogeneous without a dominant lesion. It is  not enlarged. Salivary glands are within normal limits. No significant adenopathy is present. No mass lesion is present. Upper chest: A small right pleural effusion is present with associated atelectasis. Edema is present. Esophagus is dilated with a fluid level. No mass lesion is present. Review of the MIP images confirms the above findings CTA HEAD FINDINGS Anterior circulation: Supraclinoid atherosclerotic changes are present bilaterally without significant stenosis through the ICA termini. The A1 segments and left M1 segment are normal. The right MCA is occluded just after a an early inferior MCA branch. M2 branch vessels are reconstituted just beyond the expected bifurcation with excellent collaterals. Reconstitution is more robust posteriorly. There is some paucity of vessels anteriorly. ACA branch  vessels and left MCA branch vessels are within limits. Posterior circulation: The vertebral arteries are codominant. PICA origins are within normal limits. There is some narrowing of the distal right V4 segment. Mild narrowing of less than 50% is present throughout the basilar artery with areas of dense calcification. Both posterior cerebral arteries originate from the basilar tip. Moderate narrowing is present in the right P2 segment. Mild irregularity is present in the left P2 segment without focal stenosis. Scattered narrowing of distal PCA branch vessels is present bilaterally. Venous sinuses: The dural sinuses are patent. The straight sinus deep cerebral veins are intact. Cortical veins are unremarkable. Anatomic variants: None Review of the MIP images confirms the above findings IMPRESSION: 1. Emergent large vessel occlusion of the right MCA just after an early inferior branch vessel. This corresponds with the right basal ganglia infarcts associated with the lenticulostriate vessels. 2. M2 branch vessels are reconstituted just beyond the expected bifurcation with excellent collaterals. 3. Moderate narrowing of  the right P2 segment. 4. Mild narrowing of the distal right V4 segment. 5. Mild narrowing of the basilar artery. 6. Scattered distal small vessel disease. 7. Atherosclerotic changes at the carotid bifurcations bilaterally without significant stenosis relative to the more distal vessels. 8. Advanced degenerative changes in the cervical spine. 9. Small right pleural effusion with associated atelectasis. 10. Aortic Atherosclerosis (ICD10-I70.0). These results were called by telephone at the time of interpretation on 04/27/2020 at 2: 03 pm to provider Drs. Faiz Weber and BHAGAT, who verbally acknowledged these results. Electronically Signed   By: Marin Roberts M.D.   On: 04/27/2020 14:14   CT CEREBRAL PERFUSION W CONTRAST  Result Date: 04/27/2020 CLINICAL DATA:  Right M1 occlusion.  Left-sided weakness. EXAM: CT PERFUSION BRAIN TECHNIQUE: Multiphase CT imaging of the brain was performed following IV bolus contrast injection. Subsequent parametric perfusion maps were calculated using RAPID software. CONTRAST:  40mL OMNIPAQUE IOHEXOL 350 MG/ML SOLN COMPARISON:  CT head without contrast and CTA head and neck 04/27/2020. FINDINGS: CT Brain Perfusion Findings: CBF (<30%) Volume: 0mL Perfusion (Tmax>6.0s) volume: Mismatch Volume: ASPECTS on noncontrast CT Head: 8/10 at 1:15 p.m. today. Using a higher threshold for CBF, core infarct measures up to 10 mm in the right basal ganglia, corresponding to the noncontrast CT. Infarction Location:Right MCA territory IMPRESSION: 1. Right MCA territory core infarct measuring up to 10 mm. This corresponds with the noncontrast CT head. 2. Surrounding right MCA territory penumbra measures 126 mL. Electronically Signed   By: Marin Roberts M.D.   On: 04/27/2020 14:44   CT HEAD CODE STROKE WO CONTRAST  Result Date: 04/27/2020 CLINICAL DATA:  Code stroke. Left-sided facial droop and weakness. Last seen normal at 7 p.m. last night. EXAM: CT HEAD WITHOUT CONTRAST  TECHNIQUE: Contiguous axial images were obtained from the base of the skull through the vertex without intravenous contrast. COMPARISON:  None. FINDINGS: Brain: Hypoattenuation is noted at the right caudate head and lentiform nucleus. Insular ribbon is intact. No other focal cortical abnormalities are present. Mild generalized atrophy and white matter disease is present. Left basal ganglia are within limits. Thalami are normal. There is some heterogeneity in the pons which may be artifact. Additional ischemic changes are not excluded. The cerebellum is within normal limits. Vascular: Atherosclerotic calcifications are present within the cavernous internal carotid arteries bilaterally. Distal calcifications are present at the dural margin of both vertebral arteries and within the basilar artery. No hyperdense vessel is present. Skull: Calvarium is intact. No focal lytic or  blastic lesions are present. No significant extracranial soft tissue lesion is present. Sinuses/Orbits: A fluid level is present in the left maxillary sinus. Mild mucosal thickening is present in the inferior right maxillary sinus. The paranasal sinuses and mastoid air cells are otherwise clear. Right lens replacement is present. Globes and orbits are otherwise within normal limits. ASPECTS [facility] Stroke Program Early CT Score) - Ganglionic level infarction (caudate, lentiform nuclei, internal capsule, insula, M1-M3 cortex): 5/7 - Supraganglionic infarction (M4-M6 cortex): 3/3 Total score (0-10 with 10 being normal): 8/10 IMPRESSION: 1. Hypoattenuation at the right caudate head and lentiform nucleus is concerning for acute/subacute infarct. 2. Heterogeneity in the pons may be artifact. Additional ischemic changes are not excluded. 3. ASPECTS is 8/10. 4. Mild generalized atrophy and white matter disease likely reflects the sequela of chronic microvascular ischemia. 5. Atherosclerosis. These results were called by telephone at the time of  interpretation on 04/27/2020 at 1:30 pm to provider [facility] , who verbally acknowledged these results. Electronically Signed   By: Marin Roberts M.D.   On: 04/27/2020 13:31    Procedures Procedures (including critical care time)  Medications Ordered in ED Medications  potassium chloride 10 mEq in 100 mL IVPB (has no administration in time range)  iohexol (OMNIPAQUE) 350 MG/ML injection 75 mL (75 mLs Intravenous Contrast Given 04/27/20 1333)  iohexol (OMNIPAQUE) 350 MG/ML injection 40 mL (40 mLs Intravenous Contrast Given 04/27/20 1425)   ED Course  I have reviewed the triage vital signs and the nursing notes.  Pertinent labs & imaging results that were available during my care of the patient were reviewed by me and considered in my medical decision making (see chart for details).  Clinical Course as of Apr 28 1523  Wynelle Link Apr 27, 2020  1334 CT scan of the head personally reviewed by myself with findings as noted below:  1. Hypoattenuation at the right caudate head and lentiform nucleus is concerning for acute/subacute infarct. 2. Heterogeneity in the pons may be artifact. Additional ischemic changes are not excluded. 3. ASPECTS is 8/10. 4. Mild generalized atrophy and white matter disease likely reflect the sequela of chronic microvascular ischemia. 5. Atherosclerosis.  CT HEAD CODE STROKE WO CONTRAST [LJ]  1428 CTA of the head and neck with findings as noted below.  I reviewed these findings personally.  1. Emergent large vessel occlusion of the right MCA just after an early inferior branch vessel. This corresponds with the right basal ganglia infarcts associated with the lenticulostriate vessels. 2. M2 branch vessels are reconstituted just beyond the expected bifurcation with excellent collaterals. 3. Moderate narrowing of the right P2 segment. 4. Mild narrowing of the distal right V4 segment. 5. Mild narrowing of the basilar artery. 6. Scattered distal small vessel  disease. 7. Atherosclerotic changes at the carotid bifurcations bilaterally without significant stenosis relative to the more distal vessels. 8. Advanced degenerative changes in the cervical spine. 9. Small right pleural effusion with associated atelectasis. 10. Aortic Atherosclerosis (ICD10-I70.0).  CT Angio Head W or Wo Contrast [LJ]  1444 Patient was discussed with Dr. Otelia Limes with neurology.  They currently do not have IR availability for the procedure at this time.I then spoke with Dr. Iver Nestle with neurology.  She spoke with Dr. Lanna Poche with interventional radiology at [facility] who is going to accept the patient.[facility] contacted the emergency department at [facility] and our team discussed with Dr. Erin Fulling who will accept the patient.  Transport is being arranged at this time.   [  LJ]  1507 1. Right MCA territory core infarct measuring up to 10 mm. This corresponds with the noncontrast CT head. 2. Surrounding right MCA territory penumbra measures 126 mL.  CT CEREBRAL PERFUSION W CONTRAST [LJ]    Clinical Course User Index [LJ] Placido Sou, PA-C   MDM Rules/Calculators/A&P                          Patient is a 86 year old female who presented to the emergency department due to altered mental status.  Last seen normal last night at 7 PM.  Code stroke initiated and CT scan of the head obtained with findings as noted above.  We then obtained a CTA of the head and neck as well as a cerebral perfusion study. There appears to be a large vessel occlusion of the right MCA.  Patient evaluated by and discussed with teleneurology. I spoke to Dr. Iver Nestle who reached out to [facility] as [facility] IR team does not have availability for the patient at this time.  Dr. Herbie Drape with interventional radiology is going to accept the patient.  This was discussed with Dr. Rennis Petty in the emergency department who will accept the patient for an ED to ED  transfer.  Patient's family has been updated and EMS is in route. Pt discussed with and evaluated by my attending physician Dr. Vanetta Mulders.  EMTALA has been completed by my attending physician as well.  Note: Portions of this report may have been transcribed using voice recognition software. Every effort was made to ensure accuracy; however, inadvertent computerized transcription errors may be present.   Final Clinical Impression(s) / ED Diagnoses Final diagnoses:  Cerebrovascular accident (CVA), unspecified mechanism [facility])   Rx / DC Orders ED Discharge Orders    None       Placido Sou, PA-C 04/27/20 1607    Vanetta Mulders, MD 04/28/20 2355

## 2020-04-28 DIAGNOSIS — I517 Cardiomegaly: Secondary | ICD-10-CM | POA: Diagnosis not present

## 2020-04-28 DIAGNOSIS — I4891 Unspecified atrial fibrillation: Secondary | ICD-10-CM | POA: Diagnosis not present

## 2020-04-28 DIAGNOSIS — I6523 Occlusion and stenosis of bilateral carotid arteries: Secondary | ICD-10-CM | POA: Diagnosis not present

## 2020-04-28 DIAGNOSIS — R1310 Dysphagia, unspecified: Secondary | ICD-10-CM | POA: Diagnosis not present

## 2020-04-28 DIAGNOSIS — E785 Hyperlipidemia, unspecified: Secondary | ICD-10-CM | POA: Diagnosis not present

## 2020-04-28 DIAGNOSIS — I618 Other nontraumatic intracerebral hemorrhage: Secondary | ICD-10-CM | POA: Diagnosis not present

## 2020-04-28 DIAGNOSIS — I639 Cerebral infarction, unspecified: Secondary | ICD-10-CM | POA: Diagnosis not present

## 2020-04-28 DIAGNOSIS — I10 Essential (primary) hypertension: Secondary | ICD-10-CM | POA: Diagnosis not present

## 2020-04-28 DIAGNOSIS — R509 Fever, unspecified: Secondary | ICD-10-CM | POA: Diagnosis not present

## 2020-04-28 DIAGNOSIS — I082 Rheumatic disorders of both aortic and tricuspid valves: Secondary | ICD-10-CM | POA: Diagnosis not present

## 2020-04-28 DIAGNOSIS — I63511 Cerebral infarction due to unspecified occlusion or stenosis of right middle cerebral artery: Secondary | ICD-10-CM | POA: Diagnosis not present

## 2020-04-29 DIAGNOSIS — R2681 Unsteadiness on feet: Secondary | ICD-10-CM | POA: Diagnosis not present

## 2020-04-29 DIAGNOSIS — Z515 Encounter for palliative care: Secondary | ICD-10-CM | POA: Diagnosis not present

## 2020-04-29 DIAGNOSIS — R29810 Facial weakness: Secondary | ICD-10-CM | POA: Diagnosis not present

## 2020-04-29 DIAGNOSIS — G8194 Hemiplegia, unspecified affecting left nondominant side: Secondary | ICD-10-CM | POA: Diagnosis not present

## 2020-04-29 DIAGNOSIS — I63511 Cerebral infarction due to unspecified occlusion or stenosis of right middle cerebral artery: Secondary | ICD-10-CM | POA: Diagnosis not present

## 2020-04-30 DIAGNOSIS — I63511 Cerebral infarction due to unspecified occlusion or stenosis of right middle cerebral artery: Secondary | ICD-10-CM | POA: Diagnosis not present

## 2020-04-30 DIAGNOSIS — R29810 Facial weakness: Secondary | ICD-10-CM | POA: Diagnosis not present

## 2020-04-30 DIAGNOSIS — R2681 Unsteadiness on feet: Secondary | ICD-10-CM | POA: Diagnosis not present

## 2020-04-30 DIAGNOSIS — Z515 Encounter for palliative care: Secondary | ICD-10-CM | POA: Diagnosis not present

## 2020-04-30 DIAGNOSIS — G8194 Hemiplegia, unspecified affecting left nondominant side: Secondary | ICD-10-CM | POA: Diagnosis not present

## 2020-05-01 DIAGNOSIS — R2681 Unsteadiness on feet: Secondary | ICD-10-CM | POA: Diagnosis not present

## 2020-05-01 DIAGNOSIS — R29810 Facial weakness: Secondary | ICD-10-CM | POA: Diagnosis not present

## 2020-05-01 DIAGNOSIS — Z515 Encounter for palliative care: Secondary | ICD-10-CM | POA: Diagnosis not present

## 2020-05-01 DIAGNOSIS — I63511 Cerebral infarction due to unspecified occlusion or stenosis of right middle cerebral artery: Secondary | ICD-10-CM | POA: Diagnosis not present

## 2020-05-01 DIAGNOSIS — G8194 Hemiplegia, unspecified affecting left nondominant side: Secondary | ICD-10-CM | POA: Diagnosis not present

## 2020-05-02 DIAGNOSIS — G8194 Hemiplegia, unspecified affecting left nondominant side: Secondary | ICD-10-CM | POA: Diagnosis not present

## 2020-05-02 DIAGNOSIS — I63511 Cerebral infarction due to unspecified occlusion or stenosis of right middle cerebral artery: Secondary | ICD-10-CM | POA: Diagnosis not present

## 2020-05-02 DIAGNOSIS — R29810 Facial weakness: Secondary | ICD-10-CM | POA: Diagnosis not present

## 2020-05-02 DIAGNOSIS — R2681 Unsteadiness on feet: Secondary | ICD-10-CM | POA: Diagnosis not present

## 2020-05-02 DIAGNOSIS — Z515 Encounter for palliative care: Secondary | ICD-10-CM | POA: Diagnosis not present

## 2020-05-03 DIAGNOSIS — Z515 Encounter for palliative care: Secondary | ICD-10-CM | POA: Diagnosis not present

## 2020-05-03 DIAGNOSIS — R5381 Other malaise: Secondary | ICD-10-CM | POA: Diagnosis not present

## 2020-05-03 DIAGNOSIS — I63511 Cerebral infarction due to unspecified occlusion or stenosis of right middle cerebral artery: Secondary | ICD-10-CM | POA: Diagnosis not present

## 2020-05-03 DIAGNOSIS — G9340 Encephalopathy, unspecified: Secondary | ICD-10-CM | POA: Diagnosis not present

## 2020-05-04 DIAGNOSIS — I63511 Cerebral infarction due to unspecified occlusion or stenosis of right middle cerebral artery: Secondary | ICD-10-CM | POA: Diagnosis not present

## 2020-05-04 DIAGNOSIS — R5381 Other malaise: Secondary | ICD-10-CM | POA: Diagnosis not present

## 2020-05-04 DIAGNOSIS — Z515 Encounter for palliative care: Secondary | ICD-10-CM | POA: Diagnosis not present

## 2020-05-04 DIAGNOSIS — G9340 Encephalopathy, unspecified: Secondary | ICD-10-CM | POA: Diagnosis not present

## 2020-05-05 DIAGNOSIS — G9340 Encephalopathy, unspecified: Secondary | ICD-10-CM | POA: Diagnosis not present

## 2020-05-05 DIAGNOSIS — I63511 Cerebral infarction due to unspecified occlusion or stenosis of right middle cerebral artery: Secondary | ICD-10-CM | POA: Diagnosis not present

## 2020-05-05 DIAGNOSIS — Z515 Encounter for palliative care: Secondary | ICD-10-CM | POA: Diagnosis not present

## 2020-05-05 DIAGNOSIS — R5381 Other malaise: Secondary | ICD-10-CM | POA: Diagnosis not present

## 2020-05-06 DIAGNOSIS — Z515 Encounter for palliative care: Secondary | ICD-10-CM | POA: Diagnosis not present

## 2020-05-06 DIAGNOSIS — G8194 Hemiplegia, unspecified affecting left nondominant side: Secondary | ICD-10-CM | POA: Diagnosis not present

## 2020-05-06 DIAGNOSIS — R29810 Facial weakness: Secondary | ICD-10-CM | POA: Diagnosis not present

## 2020-05-06 DIAGNOSIS — R2681 Unsteadiness on feet: Secondary | ICD-10-CM | POA: Diagnosis not present

## 2020-05-06 DIAGNOSIS — I63511 Cerebral infarction due to unspecified occlusion or stenosis of right middle cerebral artery: Secondary | ICD-10-CM | POA: Diagnosis not present

## 2020-05-07 DIAGNOSIS — R29810 Facial weakness: Secondary | ICD-10-CM | POA: Diagnosis not present

## 2020-05-07 DIAGNOSIS — G8194 Hemiplegia, unspecified affecting left nondominant side: Secondary | ICD-10-CM | POA: Diagnosis not present

## 2020-05-07 DIAGNOSIS — Z515 Encounter for palliative care: Secondary | ICD-10-CM | POA: Diagnosis not present

## 2020-05-07 DIAGNOSIS — I63511 Cerebral infarction due to unspecified occlusion or stenosis of right middle cerebral artery: Secondary | ICD-10-CM | POA: Diagnosis not present

## 2020-05-07 DIAGNOSIS — R2681 Unsteadiness on feet: Secondary | ICD-10-CM | POA: Diagnosis not present

## 2020-05-08 DIAGNOSIS — R29810 Facial weakness: Secondary | ICD-10-CM | POA: Diagnosis not present

## 2020-05-08 DIAGNOSIS — Z515 Encounter for palliative care: Secondary | ICD-10-CM | POA: Diagnosis not present

## 2020-05-08 DIAGNOSIS — R2681 Unsteadiness on feet: Secondary | ICD-10-CM | POA: Diagnosis not present

## 2020-05-08 DIAGNOSIS — G8194 Hemiplegia, unspecified affecting left nondominant side: Secondary | ICD-10-CM | POA: Diagnosis not present

## 2020-05-08 DIAGNOSIS — I63511 Cerebral infarction due to unspecified occlusion or stenosis of right middle cerebral artery: Secondary | ICD-10-CM | POA: Diagnosis not present

## 2020-05-09 DIAGNOSIS — G8194 Hemiplegia, unspecified affecting left nondominant side: Secondary | ICD-10-CM | POA: Diagnosis not present

## 2020-05-09 DIAGNOSIS — L89626 Pressure-induced deep tissue damage of left heel: Secondary | ICD-10-CM | POA: Diagnosis not present

## 2020-05-09 DIAGNOSIS — R1312 Dysphagia, oropharyngeal phase: Secondary | ICD-10-CM | POA: Diagnosis not present

## 2020-05-09 DIAGNOSIS — I63511 Cerebral infarction due to unspecified occlusion or stenosis of right middle cerebral artery: Secondary | ICD-10-CM | POA: Diagnosis not present

## 2020-05-09 DIAGNOSIS — R2681 Unsteadiness on feet: Secondary | ICD-10-CM | POA: Diagnosis not present

## 2020-05-09 DIAGNOSIS — E039 Hypothyroidism, unspecified: Secondary | ICD-10-CM | POA: Diagnosis not present

## 2020-05-09 DIAGNOSIS — R29898 Other symptoms and signs involving the musculoskeletal system: Secondary | ICD-10-CM | POA: Diagnosis not present

## 2020-05-09 DIAGNOSIS — E871 Hypo-osmolality and hyponatremia: Secondary | ICD-10-CM | POA: Diagnosis not present

## 2020-05-09 DIAGNOSIS — I63411 Cerebral infarction due to embolism of right middle cerebral artery: Secondary | ICD-10-CM | POA: Diagnosis not present

## 2020-05-09 DIAGNOSIS — I4891 Unspecified atrial fibrillation: Secondary | ICD-10-CM | POA: Diagnosis not present

## 2020-05-09 DIAGNOSIS — Z515 Encounter for palliative care: Secondary | ICD-10-CM | POA: Diagnosis not present

## 2020-05-09 DIAGNOSIS — Z8673 Personal history of transient ischemic attack (TIA), and cerebral infarction without residual deficits: Secondary | ICD-10-CM | POA: Diagnosis not present

## 2020-05-09 DIAGNOSIS — M6281 Muscle weakness (generalized): Secondary | ICD-10-CM | POA: Diagnosis not present

## 2020-05-09 DIAGNOSIS — I10 Essential (primary) hypertension: Secondary | ICD-10-CM | POA: Diagnosis not present

## 2020-05-09 DIAGNOSIS — K219 Gastro-esophageal reflux disease without esophagitis: Secondary | ICD-10-CM | POA: Diagnosis not present

## 2020-05-09 DIAGNOSIS — R2689 Other abnormalities of gait and mobility: Secondary | ICD-10-CM | POA: Diagnosis not present

## 2020-05-09 DIAGNOSIS — J449 Chronic obstructive pulmonary disease, unspecified: Secondary | ICD-10-CM | POA: Diagnosis not present

## 2020-05-09 DIAGNOSIS — Z743 Need for continuous supervision: Secondary | ICD-10-CM | POA: Diagnosis not present

## 2020-05-09 DIAGNOSIS — I69354 Hemiplegia and hemiparesis following cerebral infarction affecting left non-dominant side: Secondary | ICD-10-CM | POA: Diagnosis not present

## 2020-05-09 DIAGNOSIS — I2510 Atherosclerotic heart disease of native coronary artery without angina pectoris: Secondary | ICD-10-CM | POA: Diagnosis not present

## 2020-05-09 DIAGNOSIS — I69391 Dysphagia following cerebral infarction: Secondary | ICD-10-CM | POA: Diagnosis not present

## 2020-05-09 DIAGNOSIS — E785 Hyperlipidemia, unspecified: Secondary | ICD-10-CM | POA: Diagnosis not present

## 2020-05-09 DIAGNOSIS — R29810 Facial weakness: Secondary | ICD-10-CM | POA: Diagnosis not present

## 2020-05-09 DIAGNOSIS — M5441 Lumbago with sciatica, right side: Secondary | ICD-10-CM | POA: Diagnosis not present

## 2020-05-10 DIAGNOSIS — R2689 Other abnormalities of gait and mobility: Secondary | ICD-10-CM | POA: Diagnosis not present

## 2020-05-10 DIAGNOSIS — M5441 Lumbago with sciatica, right side: Secondary | ICD-10-CM | POA: Diagnosis not present

## 2020-05-10 DIAGNOSIS — M6281 Muscle weakness (generalized): Secondary | ICD-10-CM | POA: Diagnosis not present

## 2020-05-13 DIAGNOSIS — I4891 Unspecified atrial fibrillation: Secondary | ICD-10-CM | POA: Diagnosis not present

## 2020-05-13 DIAGNOSIS — G8194 Hemiplegia, unspecified affecting left nondominant side: Secondary | ICD-10-CM | POA: Diagnosis not present

## 2020-05-13 DIAGNOSIS — I63411 Cerebral infarction due to embolism of right middle cerebral artery: Secondary | ICD-10-CM | POA: Diagnosis not present

## 2020-05-13 DIAGNOSIS — I10 Essential (primary) hypertension: Secondary | ICD-10-CM | POA: Diagnosis not present

## 2020-05-14 DIAGNOSIS — R1312 Dysphagia, oropharyngeal phase: Secondary | ICD-10-CM | POA: Diagnosis not present

## 2020-05-14 DIAGNOSIS — I10 Essential (primary) hypertension: Secondary | ICD-10-CM | POA: Diagnosis not present

## 2020-05-14 DIAGNOSIS — Z8673 Personal history of transient ischemic attack (TIA), and cerebral infarction without residual deficits: Secondary | ICD-10-CM | POA: Diagnosis not present

## 2020-05-14 DIAGNOSIS — G8194 Hemiplegia, unspecified affecting left nondominant side: Secondary | ICD-10-CM | POA: Diagnosis not present

## 2020-05-19 ENCOUNTER — Telehealth: Payer: Self-pay

## 2020-05-19 ENCOUNTER — Other Ambulatory Visit: Payer: Self-pay | Admitting: Family Medicine

## 2020-05-19 MED ORDER — DILTIAZEM HCL 60 MG PO TABS: 60.0000 mg | ORAL_TABLET | Freq: Four times a day (QID) | ORAL | 0 refills | Status: AC

## 2020-05-19 MED ORDER — ACETAMINOPHEN 325 MG PO TABS: 650.0000 mg | ORAL_TABLET | Freq: Four times a day (QID) | ORAL | 99 refills | Status: AC | PRN

## 2020-05-19 NOTE — Telephone Encounter (Signed)
Please let the patient know that I sent their prescription to their pharmacy. Thanks, WS 

## 2020-05-19 NOTE — Telephone Encounter (Signed)
Nancy Holt from hospice calliing--pt is having difficulty swallowing asking to change cardizem to immedicate relief so it can be opened/crushed. Change tylenol arthritis to regular tylenol. Please fax rx to [facility]. Please call back so [name] can write the order for this.

## 2020-05-19 NOTE — Telephone Encounter (Signed)
LMOVM to [facility] w/ Hospice that this has been done & sent to [facility]

## 2020-06-10 DIAGNOSIS — R2689 Other abnormalities of gait and mobility: Secondary | ICD-10-CM | POA: Diagnosis not present

## 2020-06-10 DIAGNOSIS — M6281 Muscle weakness (generalized): Secondary | ICD-10-CM | POA: Diagnosis not present

## 2020-06-10 DIAGNOSIS — M5441 Lumbago with sciatica, right side: Secondary | ICD-10-CM | POA: Diagnosis not present

## 2020-07-10 DIAGNOSIS — M5441 Lumbago with sciatica, right side: Secondary | ICD-10-CM | POA: Diagnosis not present

## 2020-07-10 DIAGNOSIS — R2689 Other abnormalities of gait and mobility: Secondary | ICD-10-CM | POA: Diagnosis not present

## 2020-07-10 DIAGNOSIS — M6281 Muscle weakness (generalized): Secondary | ICD-10-CM | POA: Diagnosis not present

## 2020-08-10 DIAGNOSIS — R2689 Other abnormalities of gait and mobility: Secondary | ICD-10-CM | POA: Diagnosis not present

## 2020-08-10 DIAGNOSIS — M6281 Muscle weakness (generalized): Secondary | ICD-10-CM | POA: Diagnosis not present

## 2020-08-10 DIAGNOSIS — M5441 Lumbago with sciatica, right side: Secondary | ICD-10-CM | POA: Diagnosis not present

## 2020-09-10 DIAGNOSIS — R2689 Other abnormalities of gait and mobility: Secondary | ICD-10-CM | POA: Diagnosis not present

## 2020-09-10 DIAGNOSIS — M5441 Lumbago with sciatica, right side: Secondary | ICD-10-CM | POA: Diagnosis not present

## 2020-09-10 DIAGNOSIS — M6281 Muscle weakness (generalized): Secondary | ICD-10-CM | POA: Diagnosis not present

## 2020-09-22 ENCOUNTER — Ambulatory Visit: Payer: Self-pay | Admitting: Nurse Practitioner

## 2020-10-08 DIAGNOSIS — M5441 Lumbago with sciatica, right side: Secondary | ICD-10-CM | POA: Diagnosis not present

## 2020-10-08 DIAGNOSIS — M6281 Muscle weakness (generalized): Secondary | ICD-10-CM | POA: Diagnosis not present

## 2020-10-08 DIAGNOSIS — R2689 Other abnormalities of gait and mobility: Secondary | ICD-10-CM | POA: Diagnosis not present

## 2020-10-30 ENCOUNTER — Telehealth: Payer: Self-pay | Admitting: Nurse Practitioner

## 2020-10-30 NOTE — Telephone Encounter (Signed)
Pt is in hospice care and would like to be prescribed a stool softener, 1 time daily as needed

## 2020-10-31 MED ORDER — DOCUSATE SODIUM 100 MG PO CAPS: 100.0000 mg | ORAL_CAPSULE | Freq: Two times a day (BID) | ORAL | 2 refills | Status: AC | PRN

## 2020-10-31 NOTE — Telephone Encounter (Signed)
Colace 100 mg PO BID PRN sent to [facility].

## 2020-11-08 DIAGNOSIS — R2689 Other abnormalities of gait and mobility: Secondary | ICD-10-CM | POA: Diagnosis not present

## 2020-11-08 DIAGNOSIS — M5441 Lumbago with sciatica, right side: Secondary | ICD-10-CM | POA: Diagnosis not present

## 2020-11-08 DIAGNOSIS — M6281 Muscle weakness (generalized): Secondary | ICD-10-CM | POA: Diagnosis not present

## 2020-12-02 ENCOUNTER — Telehealth: Payer: Self-pay | Admitting: *Deleted

## 2020-12-02 NOTE — Telephone Encounter (Signed)
FYI: VM from [facility] w/ Hospice Pt over weekend may have had another stroke. She is unresponsive, has not eating or drink since Friday. BP is up. They have made her imminent. Will be checking on her daily

## 2020-12-08 DIAGNOSIS — M5441 Lumbago with sciatica, right side: Secondary | ICD-10-CM | POA: Diagnosis not present

## 2020-12-08 DIAGNOSIS — M6281 Muscle weakness (generalized): Secondary | ICD-10-CM | POA: Diagnosis not present

## 2020-12-08 DIAGNOSIS — R2689 Other abnormalities of gait and mobility: Secondary | ICD-10-CM | POA: Diagnosis not present

## 2020-12-11 ENCOUNTER — Telehealth: Payer: Self-pay

## 2020-12-14 IMAGING — CT CT HEAD CODE STROKE
3 series · 15 of 47 positions shown, 18 images · non-contrast
Comparison: None.

CLINICAL DATA: Code stroke. Left-sided facial droop and weakness.
Last seen normal at 7 p.m. last night.

EXAM:
CT HEAD WITHOUT CONTRAST
TECHNIQUE: Contiguous axial images were obtained from the base of the skull
through the vertex without intravenous contrast.

[Series 2: head w o · axial · 0.41mm/px · z∈[+127,+262]mm · 9 of 33 slices shown, 12 images]
[im 3/33  brain]
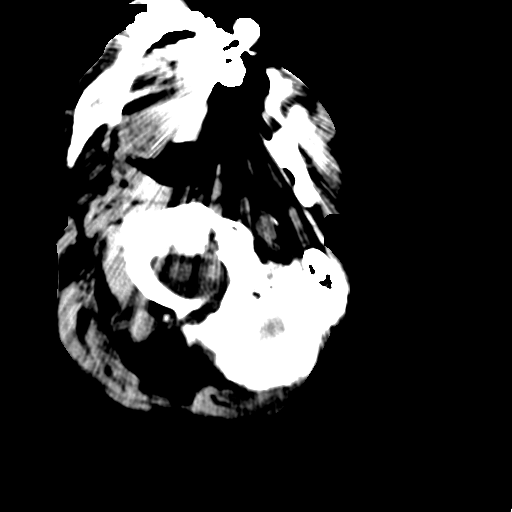
[im 3/33  bone]
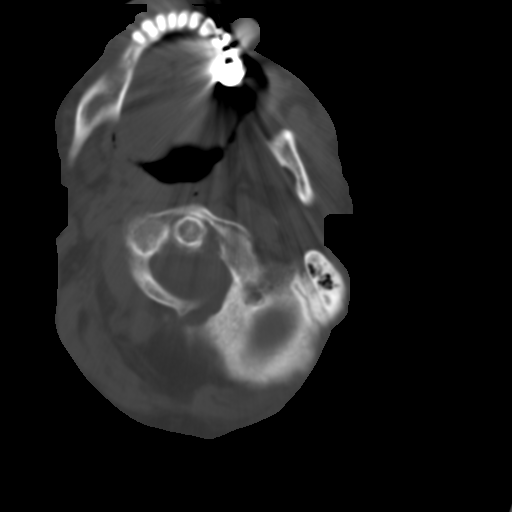
[im 6/33  brain]
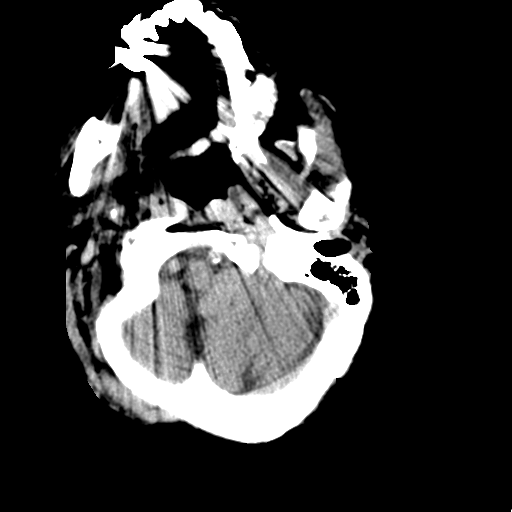
[im 9/33  brain]
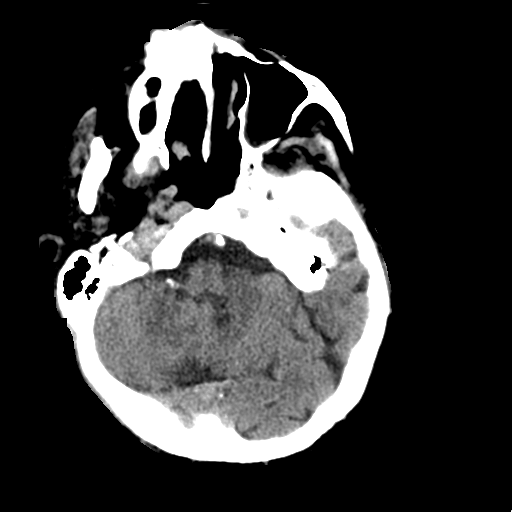
[im 13/33  brain]
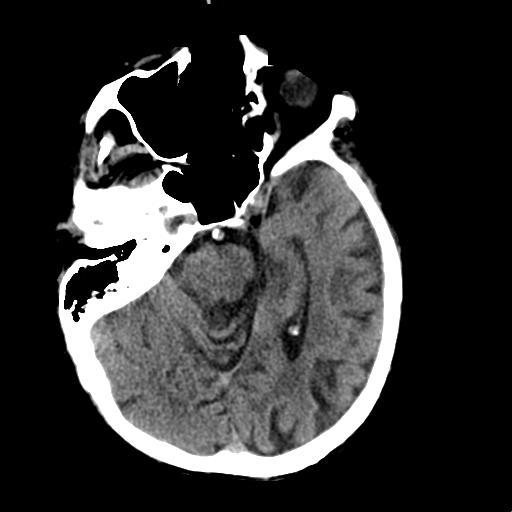
[im 17/33  brain]
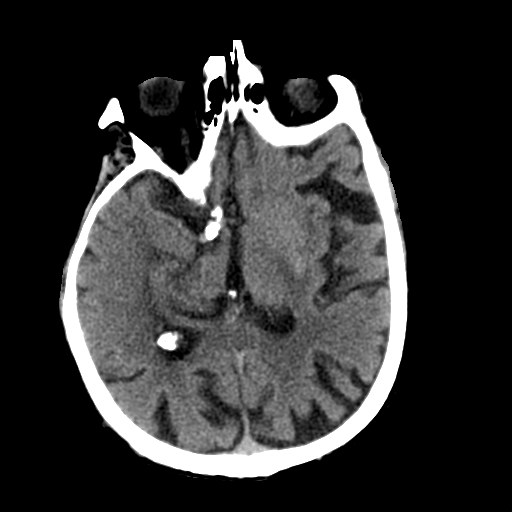
[im 17/33  bone]
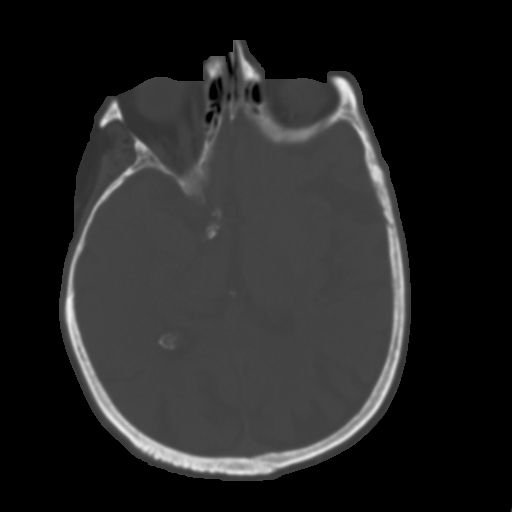
[im 20/33  brain]
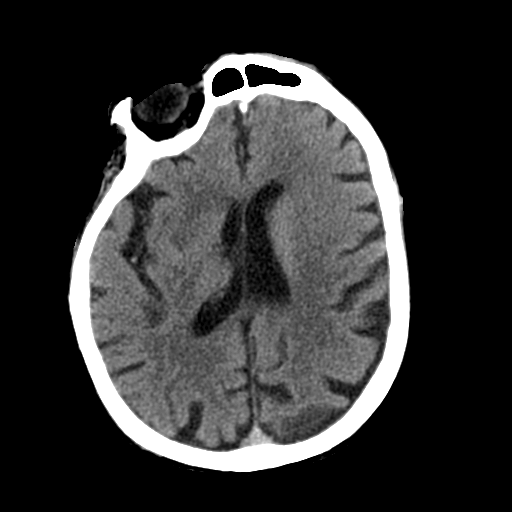
[im 24/33  brain]
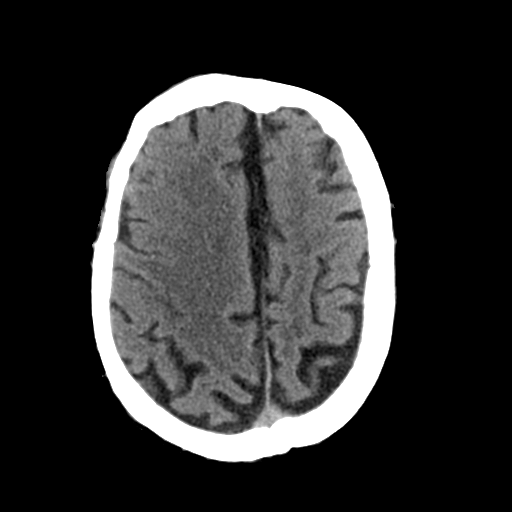
[im 27/33  brain]
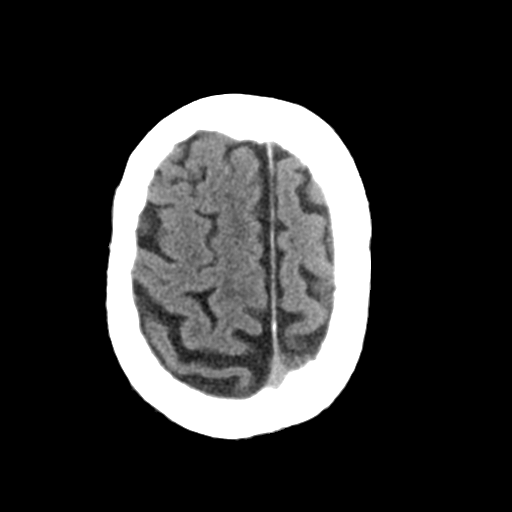
[im 30/33  brain]
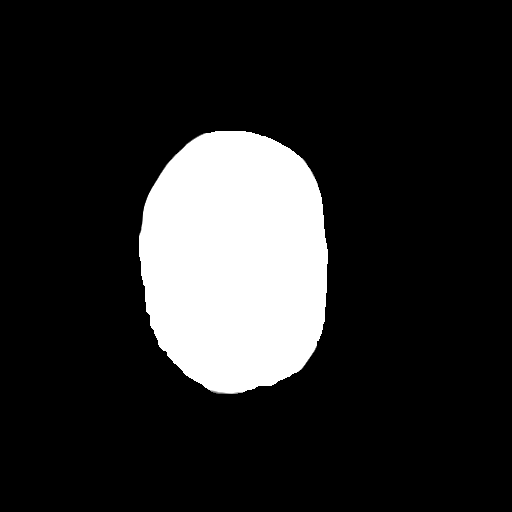
[im 30/33  bone]
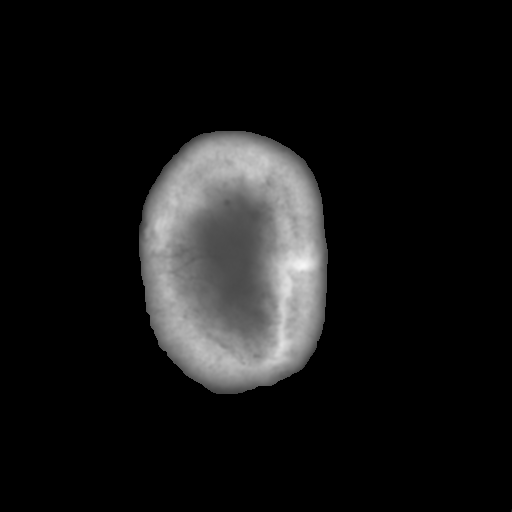

[Series 4: coronal soft · coronal · 0.32mm/px · 3 of 68 slices shown]
[im 23/68  brain]
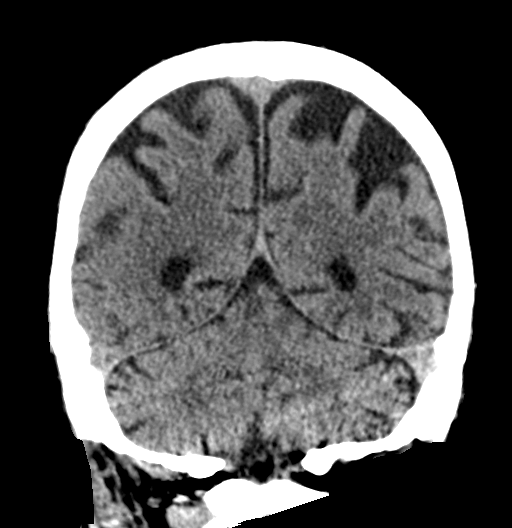
[im 30/68  brain]
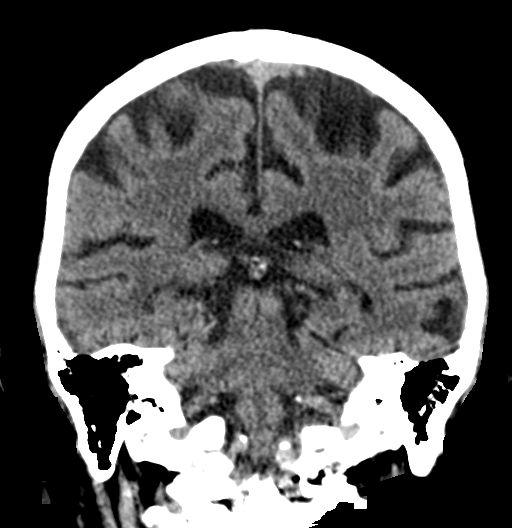
[im 38/68  brain]
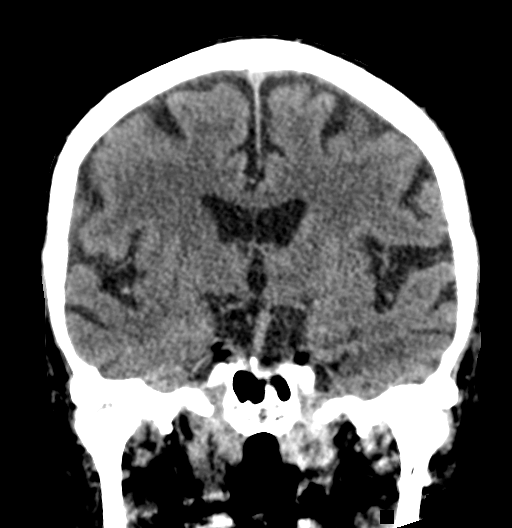

[Series 5: sagittal soft · sagittal · 0.34mm/px · 3 of 53 slices shown]
[im 24/53  brain]
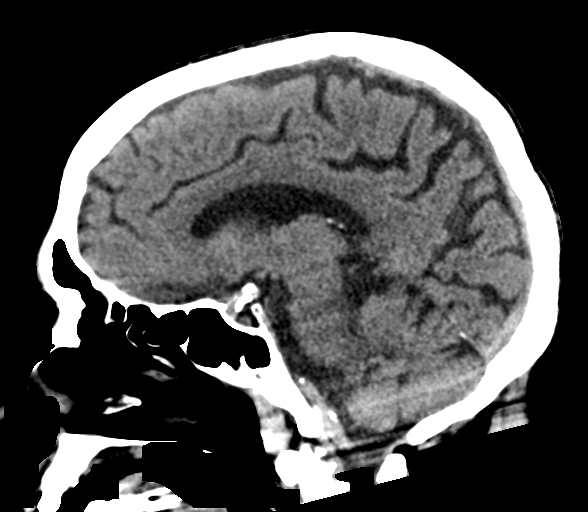
[im 27/53  brain]
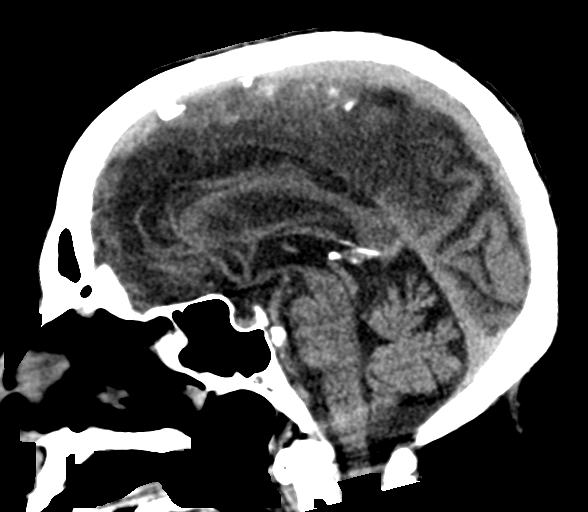
[im 31/53  brain]
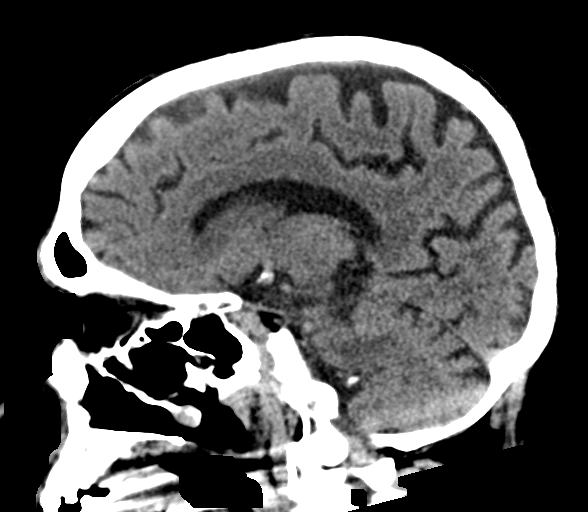

[15 of 47 positions shown; findings below may reference images not displayed]

FINDINGS: Brain: Hypoattenuation is noted at the right caudate head and
lentiform nucleus. Insular ribbon is intact. No other focal cortical
abnormalities are present. Mild generalized atrophy and white matter
disease is present. Left basal ganglia are within limits. Thalami
are normal. There is some heterogeneity in the pons which may be
artifact. Additional ischemic changes are not excluded. The
cerebellum is within normal limits.

Vascular: Atherosclerotic calcifications are present within the
cavernous internal carotid arteries bilaterally. Distal
calcifications are present at the dural margin of both vertebral
arteries and within the basilar artery. No hyperdense vessel is
present.

Skull: Calvarium is intact. No focal lytic or blastic lesions are
present. No significant extracranial soft tissue lesion is present.

Sinuses/Orbits: A fluid level is present in the left maxillary
sinus. Mild mucosal thickening is present in the inferior right
maxillary sinus. The paranasal sinuses and mastoid air cells are
otherwise clear. Right lens replacement is present. Globes and
orbits are otherwise within normal limits.

ASPECTS (Alberta Stroke Program Early CT Score)

- Ganglionic level infarction (caudate, lentiform nuclei, internal
capsule, insula, M1-M3 cortex): [DATE]

- Supraganglionic infarction (M4-M6 cortex): [DATE]

Total score (0-10 with 10 being normal): [DATE]
IMPRESSION: 1. Hypoattenuation at the right caudate head and lentiform nucleus
is concerning for acute/subacute infarct.
2. Heterogeneity in the pons may be artifact. Additional ischemic
changes are not excluded.
3. ASPECTS is [DATE].
4. Mild generalized atrophy and white matter disease likely reflects
the sequela of chronic microvascular ischemia.
5. Atherosclerosis.

These results were called by telephone at the time of interpretation
on 04/27/2020 at [DATE] to provider SHINOHARA BRIGHT , who verbally
acknowledged these results.

## 2020-12-17 NOTE — Telephone Encounter (Signed)
Marchelle Folks from [facility] called to let us know patient passed away this morning at 11:38 am.  She is one of [facility]'s patients.  Call if you have any questions.

## 2020-12-17 DEATH — deceased
